# Patient Record
Sex: Female | Born: 1990 | Hispanic: Yes | Smoking: Never smoker
Health system: Southern US, Community
[De-identification: ages and names within clinical notes are randomized; demographics above are authoritative.]

## PROBLEM LIST (undated history)

## (undated) ENCOUNTER — Inpatient Hospital Stay (HOSPITAL_COMMUNITY): Payer: Self-pay

## (undated) HISTORY — PX: NO PAST SURGERIES: SHX2092

---

## 2012-08-29 ENCOUNTER — Emergency Department (HOSPITAL_COMMUNITY)
Admission: EM | Admit: 2012-08-29 | Discharge: 2012-08-29 | Disposition: A | Discharge status: Home / Self Care | Payer: Self-pay | Attending: Emergency Medicine | Admitting: Emergency Medicine

## 2012-08-29 ENCOUNTER — Encounter (HOSPITAL_COMMUNITY): Payer: Self-pay | Admitting: *Deleted

## 2012-08-29 DIAGNOSIS — K2990 Gastroduodenitis, unspecified, without bleeding: Secondary | ICD-10-CM | POA: Insufficient documentation

## 2012-08-29 DIAGNOSIS — K2970 Gastritis, unspecified, without bleeding: Secondary | ICD-10-CM | POA: Insufficient documentation

## 2012-08-29 DIAGNOSIS — Z3202 Encounter for pregnancy test, result negative: Secondary | ICD-10-CM | POA: Insufficient documentation

## 2012-08-29 LAB — CBC WITH DIFFERENTIAL/PLATELET
Basophils Relative: 1 % (ref 0–1)
Eosinophils Absolute: 0.2 10*3/uL (ref 0.0–0.7)
Eosinophils Relative: 3 % (ref 0–5)
Lymphs Abs: 2.3 10*3/uL (ref 0.7–4.0)
MCH: 29.9 pg (ref 26.0–34.0)
MCHC: 35.1 g/dL (ref 30.0–36.0)
MCV: 85.3 fL (ref 78.0–100.0)
Platelets: 226 10*3/uL (ref 150–400)
RBC: 4.48 MIL/uL (ref 3.87–5.11)
RDW: 12.9 % (ref 11.5–15.5)

## 2012-08-29 LAB — COMPREHENSIVE METABOLIC PANEL
ALT: 10 U/L (ref 0–35)
Albumin: 3.7 g/dL (ref 3.5–5.2)
Calcium: 8.7 mg/dL (ref 8.4–10.5)
GFR calc Af Amer: >90 mL/min (ref >90)
Glucose, Bld: 88 mg/dL (ref 70–99)
Sodium: 136 mEq/L (ref 135–145)
Total Protein: 7 g/dL (ref 6.0–8.3)

## 2012-08-29 LAB — URINALYSIS, ROUTINE W REFLEX MICROSCOPIC
Bilirubin Urine: NEGATIVE
Glucose, UA: NEGATIVE mg/dL
Nitrite: NEGATIVE
Specific Gravity, Urine: 1.025 (ref 1.005–1.030)
pH: 7 (ref 5.0–8.0)

## 2012-08-29 LAB — POCT PREGNANCY, URINE: Preg Test, Ur: NEGATIVE

## 2012-08-29 LAB — URINE MICROSCOPIC-ADD ON

## 2012-08-29 MED ORDER — GI COCKTAIL ~~LOC~~
30.0000 mL | Freq: Once | ORAL | Status: AC
  Administered 2012-08-29: 30 mL via ORAL
  Filled 2012-08-29: qty 30

## 2012-08-29 MED ORDER — LANSOPRAZOLE 30 MG PO CPDR: 30.0000 mg | DELAYED_RELEASE_CAPSULE | Freq: Every day | ORAL | Status: DC

## 2012-08-29 MED ORDER — PANTOPRAZOLE SODIUM 40 MG IV SOLR
40.0000 mg | Freq: Once | INTRAVENOUS | Status: AC
  Administered 2012-08-29: 40 mg via INTRAVENOUS
  Filled 2012-08-29: qty 40

## 2012-08-29 NOTE — ED Notes (Signed)
[employer] [phone] used for Spanish, through interpreter, pt states "have had this pain for a couple of days, hurts when I press on it, is not constant, is a stabbing pain, LMP 10/2, no n/v/d"

## 2012-08-29 NOTE — ED Provider Notes (Signed)
History     CSN: [id number]  Arrival date & time 08/29/12  1019   First MD Initiated Contact with Patient 08/29/12 1056      Chief Complaint  Patient presents with  . Abdominal Pain    (Consider location/radiation/quality/duration/timing/severity/associated sxs/prior treatment) HPI Pt has had 4 days of episodic epigastric and LUQ pain. Not associated with food. No N/V/D/C. No vaginal complaints. No fever or chills. Pt states she has been taking multiple doses of naproxen for past few days.  History reviewed. No pertinent past medical history.  History reviewed. No pertinent past surgical history.  No family history on file.  History  Substance Use Topics  . Smoking status: Never Smoker   . Smokeless tobacco: Not on file  . Alcohol Use: No    OB History    Grav Para Term Preterm Abortions TAB SAB Ect Mult Living                  Review of Systems  Constitutional: Negative for fever and chills.  Respiratory: Negative for shortness of breath.   Cardiovascular: Negative for chest pain.  Gastrointestinal: Positive for abdominal pain. Negative for nausea, vomiting, diarrhea and constipation.  Genitourinary: Negative for dysuria, frequency, hematuria, vaginal bleeding, vaginal discharge, vaginal pain and pelvic pain.  Musculoskeletal: Negative for back pain.    Allergies  Review of patient's allergies indicates no known allergies.  Home Medications   Current Outpatient Rx  Name Route Sig Dispense Refill  . ACETAMINOPHEN 325 MG PO TABS Oral Take 325 mg by mouth every 6 (six) hours as needed. For pain    . LANSOPRAZOLE 30 MG PO CPDR Oral Take 1 capsule (30 mg total) by mouth daily. 30 capsule 0    BP 129/69  Pulse 62  Temp 98.4 F (36.9 C) (Oral)  Resp 20  Wt 105 lb 13.1 oz (48 kg)  SpO2 99%  LMP 07/30/2012  Physical Exam  Nursing note and vitals reviewed. Constitutional: She is oriented to person, place, and time. She appears well-developed and  well-nourished. No distress.  HENT:  Head: Normocephalic and atraumatic.  Mouth/Throat: Oropharynx is clear and moist.  Eyes: EOM are normal. Pupils are equal, round, and reactive to light.  Neck: Normal range of motion. Neck supple.  Cardiovascular: Normal rate and regular rhythm.   Pulmonary/Chest: Effort normal and breath sounds normal. No respiratory distress. She has no wheezes. She has no rales.  Abdominal: Soft. Bowel sounds are normal. She exhibits no distension and no mass. There is tenderness (epigastric/LUQ ttp. no rebound or guarding). There is no rebound and no guarding.  Musculoskeletal: Normal range of motion. She exhibits no edema and no tenderness.  Neurological: She is alert and oriented to person, place, and time.  Skin: Skin is warm and dry. No rash noted. No erythema.  Psychiatric: She has a normal mood and affect. Her behavior is normal.    ED Course  Procedures (including critical care time)  Labs Reviewed  URINALYSIS, ROUTINE W REFLEX MICROSCOPIC - Abnormal; Notable for the following:    APPearance CLOUDY (*)     Hgb urine dipstick SMALL (*)     Leukocytes, UA MODERATE (*)     All other components within normal limits  URINE MICROSCOPIC-ADD ON - Abnormal; Notable for the following:    Bacteria, UA FEW (*)     All other components within normal limits  CBC WITH DIFFERENTIAL  COMPREHENSIVE METABOLIC PANEL  LIPASE, BLOOD  POCT PREGNANCY, URINE  URINE  CULTURE   No results found.   1. Gastritis       MDM   Pt states she is feeling much better after meds. Discussed through interpreter need to avoid NSAID's, certain foods. Will rx PPI and advise OTC antacids. Pt told to return immediately for blood in vomit or stool, fever or worsening pain. All questions were answered.        Loren Racer, MD 08/29/12 [phone]

## 2012-08-31 LAB — URINE CULTURE: Colony Count: 95000

## 2012-09-03 NOTE — ED Notes (Signed)
+  Urine. Chart sent to EDP office for review. Chart returned from EDP office. Per [facility] PA-C, likely contaminant. Follow-up with PCP or return to ER for changing/worsening symptoms.

## 2012-10-29 NOTE — L&D Delivery Note (Signed)
Delivery Note At  a viable and healthy female was delivered via  (Presentation: vertex ; LOA ).  APGAR: 9, 9; weight pending .   Placenta status: spontaneous intact.  Cord: 3 vessel with the following complications: None.  Cord pH: not indicated  Anesthesia:  None Episiotomy:  None Lacerations: Small hemostatic laceration left periurethral  Suture Repair: n/a Est. Blood Loss (mL):  Mom to postpartum.  Baby to nursery-stable.  HAIRFORD, AMBER 06/15/2013, 4:47 PM     I was present for and supervised the delivery for this pt.  I agree with the documentation above done by  Dr. Mikel Cella.   Dorthy Hustead, [place] Baseman, MD

## 2012-11-11 ENCOUNTER — Ambulatory Visit: Payer: Self-pay | Admitting: Physician Assistant

## 2012-11-11 VITALS — BP 118/76 | HR 66 | Temp 98.6°F | Resp 16 | Ht <= 58 in | Wt 113.0 lb

## 2012-11-11 DIAGNOSIS — N926 Irregular menstruation, unspecified: Secondary | ICD-10-CM

## 2012-11-11 LAB — POCT URINE PREGNANCY: Preg Test, Ur: POSITIVE

## 2012-11-11 MED ORDER — PRENATAL VITAMINS 28-0.8 MG PO TABS: 1.0000 | ORAL_TABLET | Freq: Every day | ORAL | Status: DC

## 2012-11-11 NOTE — Progress Notes (Signed)
  Subjective:    Patient ID: Susan Freeman, female    DOB: July 16, 1991, 22 y.o.   MRN: [id number]  HPI 22 year old female presents for pregnancy test.  She needs a lab result showing positive test to establish with OB.  LNMP 09/12/12. Denies any bleeding or fluids.  Does have slight abdominal cramping and discomfort. No nausea or vomiting.  She overall feels well. She does not take any daily medications, no known medical problems.      Review of Systems  Cardiovascular: Negative for chest pain.  Gastrointestinal: Positive for abdominal pain (slight cramping). Negative for nausea and vomiting.  Genitourinary: Negative for dysuria, frequency, vaginal bleeding and vaginal discharge.  All other systems reviewed and are negative.       Objective:   Physical Exam  Constitutional: She is oriented to person, place, and time. She appears well-developed and well-nourished.  HENT:  Head: Normocephalic and atraumatic.  Right Ear: External ear normal.  Left Ear: External ear normal.  Eyes: Conjunctivae normal are normal.  Neck: Normal range of motion.  Cardiovascular: Normal rate, regular rhythm and normal heart sounds.   Pulmonary/Chest: Effort normal and breath sounds normal.  Abdominal: Soft. Bowel sounds are normal. There is no tenderness. There is no rebound and no guarding.  Neurological: She is alert and oriented to person, place, and time.  Psychiatric: She has a normal mood and affect. Her behavior is normal. Judgment and thought content normal.     Results for orders placed in visit on 11/11/12  POCT URINE PREGNANCY      Component Value Range   Preg Test, Ur Positive         Assessment & Plan:   1. Menstrual irregularity  POCT urine pregnancy   Start prenatal vitamins.  Copy of labs given to patient - follow up with obstetrics Patient instructed to RTC or go to ER with any worsening abdominal pain or bleeding.

## 2012-11-11 NOTE — Patient Instructions (Signed)
LNMP 09/12/12, estimated due date is 06/19/13

## 2012-12-04 ENCOUNTER — Other Ambulatory Visit: Payer: Self-pay

## 2012-12-04 DIAGNOSIS — Z331 Pregnant state, incidental: Secondary | ICD-10-CM

## 2012-12-04 NOTE — Progress Notes (Signed)
PRENATAL LABS DONE TODAY Susan Freeman 

## 2012-12-05 LAB — OBSTETRIC PANEL
Antibody Screen: NEGATIVE
Basophils Absolute: 0 10*3/uL (ref 0.0–0.1)
Basophils Relative: 0 % (ref 0–1)
Hepatitis B Surface Ag: NEGATIVE
MCHC: 34.4 g/dL (ref 30.0–36.0)
Monocytes Absolute: 0.6 10*3/uL (ref 0.1–1.0)
Neutro Abs: 7 10*3/uL (ref 1.7–7.7)
Neutrophils Relative %: 74 % (ref 43–77)
Platelets: 237 10*3/uL (ref 150–400)
RDW: 14.1 % (ref 11.5–15.5)

## 2012-12-07 LAB — CULTURE, OB URINE: Colony Count: >=100000

## 2012-12-11 ENCOUNTER — Encounter: Payer: Self-pay | Admitting: Family Medicine

## 2013-01-07 ENCOUNTER — Ambulatory Visit (INDEPENDENT_AMBULATORY_CARE_PROVIDER_SITE_OTHER): Payer: Self-pay | Admitting: Family Medicine

## 2013-01-07 ENCOUNTER — Encounter: Payer: Self-pay | Admitting: Family Medicine

## 2013-01-07 DIAGNOSIS — Z3400 Encounter for supervision of normal first pregnancy, unspecified trimester: Secondary | ICD-10-CM

## 2013-01-07 LAB — POCT WET PREP (WET MOUNT): WBC, Wet Prep HPF POC: >20

## 2013-01-07 MED ORDER — AMOXICILLIN 500 MG PO CAPS: 500.0000 mg | ORAL_CAPSULE | Freq: Two times a day (BID) | ORAL | Status: DC

## 2013-01-07 NOTE — Patient Instructions (Signed)
Embarazo - Segundo trimestre (Pregnancy - Second Trimester) El segundo trimestre del embarazo (del 3 al 6mes) es un perodo de evolucin rpida para usted y el beb. Hacia el final del sexto mes, el beb mide aproximadamente 23 cm y pesa 680 g. Comenzar a sentir los movimientos del beb entre las 18 y las 20 semanas de embarazo. Podr sentir las pataditas ("quickening en ingls"). Hay un rpido aumento de peso. Puede segregar un lquido claro (calostro) de las mamas. Quizs sienta pequeas contracciones en el vientre (tero) Esto se conoce como falso trabajo de parto o contracciones de Braxton-Hicks. Es como una prctica del trabajo de parto que se produce cuando el beb est listo para salir. Generalmente los problemas de vmitos matinales ya se han superado hacia el final del primer trimestre. Algunas mujeres desarrollan pequeas manchas oscuras (que se denominan cloasma, mscara del embarazo) en la cara que normalmente se van luego del nacimiento del beb. La exposicin al sol empeora las manchas. Puede desarrollarse acn en algunas mujeres embarazadas, y puede desaparecer en aquellas que ya tienen acn. EXAMENES PRENATALES  Durante los exmenes prenatales, deber seguir realizando pruebas de sangre, segn avance el embarazo. Estas pruebas se realizan para controlar su salud y la del beb. Tambin se realizan anlisis de sangre para conocer los niveles de hemoglobina. La anemia (bajo nivel de hemoglobina) es frecuente durante el embarazo. Para prevenirla, se administran hierro y vitaminas. Tambin se le realizarn exmenes para saber si tiene diabetes entre las 24 y las 28 semanas del embarazo. Podrn repetirle algunas de las pruebas que le hicieron previamente.  En cada visita le medirn el tamao del tero. Esto se realiza para asegurarse de que el beb est creciendo correctamente de acuerdo al estado del embarazo.  Tambin en cada visita prenatal controlarn su presin arterial. Esto se realiza  para asegurarse de que no tenga toxemia.  Se controlar su orina para asegurarse de que no tenga infecciones, diabetes o protena en la orina.  Se controlar su peso regularmente para asegurarse que el aumento ocurre al ritmo indicado. Esto se hace para asegurarse que usted y el beb tienen una evolucin normal.  En algunas ocasiones se realiza una prueba de ultrasonido para confirmar el correcto desarrollo y evolucin del beb. Esta prueba se realiza con ondas sonoras inofensivas para el beb, de modo que el profesional pueda calcular ms precisamente la fecha del parto. Algunas veces se realizan pruebas especializadas del lquido amnitico que rodea al beb. Esta prueba se denomina amniocentesis. El lquido amnitico se obtiene introduciendo una aguja en el vientre (abdomen). Se realiza para controlar los cromosomas en aquellos casos en los que existe alguna preocupacin acerca de algn problema gentico que pueda sufrir el beb. En ocasiones se lleva a cabo cerca del final del embarazo, si es necesario inducir al parto. En este caso se realiza para asegurarse que los pulmones del beb estn lo suficientemente maduros como para que pueda vivir fuera del tero. CAMBIOS QUE OCURREN EN EL SEGUNDO TRIMESTRE DEL EMBARAZO Su organismo atravesar numerosos cambios durante el embarazo. Estos pueden variar de una persona a otra. Converse con el profesional que la asiste acerca los cambios que usted note y que la preocupen.  Durante el segundo trimestre probablemente sienta un aumento del apetito. Es normal tener "antojos" de ciertas comidas. Esto vara de una persona a otra y de un embarazo a otro.  El abdomen inferior comenzar a abultarse.  Podr tener la necesidad de orinar con ms frecuencia debido a que   el tero y el beb presionan sobre la vejiga. Tambin es frecuente contraer ms infecciones urinarias durante el embarazo (dolor al orinar). Puede evitarlas bebiendo gran cantidad de lquidos y vaciando  la vejiga antes y despus de mantener relaciones sexuales.  Podrn aparecer las primeras estras en las caderas, abdomen y mamas. Estos son cambios normales del cuerpo durante el embarazo. No existen medicamentos ni ejercicios que puedan prevenir estos cambios.  Es posible que comience a desarrollar venas inflamadas y abultadas (varices) en las piernas. El uso de medias de descanso, elevar sus pies durante 15 minutos, 3 a 4 veces al da y limitar la sal en su dieta ayuda a aliviar el problema.  Podr sentir acidez gstrica a medida que el tero crece y presiona contra el estmago. Puede tomar anticidos, con la autorizacin de su mdico, para aliviar este problema. Tambin es til ingerir pequeas comidas 4 a 5 veces al da.  La constipacin puede tratarse con un laxante o agregando fibra a su dieta. Beber grandes cantidades de lquidos, comer vegetales, frutas y granos integrales es de gran ayuda.  Tambin es beneficioso practicar actividad fsica. Si ha sido una persona activa hasta el embarazo, podr continuar con la mayora de las actividades durante el mismo. Si ha sido menos activa, puede ser beneficioso que comience con un programa de ejercicios, como realizar caminatas.  Puede desarrollar hemorroides (vrices en el recto) hacia el final del segundo trimestre. Tomar baos de asiento tibios y utilizar cremas recomendadas por el profesional que lo asiste sern de ayuda para los problemas de hemorroides.  Tambin podr sentir dolor de espalda durante este momento de su embarazo. Evite levantar objetos pesados, utilice zapatos de taco bajo y mantenga una buena postura para ayudar a reducir los problemas de espalda.  Algunas mujeres embarazadas desarrollan hormigueo y adormecimiento de la mano y los dedos debido a la hinchazn y compresin de los ligamentos de la mueca (sndrome del tnel carpiano). Esto desaparece una vez que el beb nace.  Como sus pechos se agrandan, necesitar un sujetador  ms grande. Use un sostn de soporte, cmodo y de algodn. No utilice un sostn para amamantar hasta el ltimo mes de embarazo si va a amamantar al beb.  Podr observar una lnea oscura desde el ombligo hacia la zona pbica denominada linea nigra.  Podr observar que sus mejillas se ponen coloradas debido al aumento de flujo sanguneo en la cara.  Podr desarrollar "araitas" en la cara, cuello y pecho. Esto desaparece una vez que el beb nace. INSTRUCCIONES PARA EL CUIDADO DOMICILIARIO  Es extremadamente importante que evite el cigarrillo, hierbas medicinales, alcohol y las drogas no prescriptas durante el embarazo. Estas sustancias qumicas afectan la formacin y el desarrollo del beb. Evite estas sustancias durante todo el embarazo para asegurar el nacimiento de un beb sano.  La mayor parte de los cuidados que se aconsejan son los mismos que los indicados para el primer trimestre del embarazo. Cumpla con las citas tal como se le indic. Siga las instrucciones del profesional que lo asiste con respecto al uso de los medicamentos, el ejercicio y la dieta.  Durante el embarazo debe obtener nutrientes para usted y para su beb. Consuma alimentos balanceados a intervalos regulares. Elija alimentos como carne, pescado, leche y otros productos lcteos descremados, vegetales, frutas, panes integrales y cereales. El profesional le informar cul es el aumento de peso ideal.  Las relaciones sexuales fsicas pueden continuarse hasta cerca del fin del embarazo si no existen otros problemas. Estos   problemas pueden ser la prdida temprana (prematura) de lquido amnitico de las membranas, sangrado vaginal, dolor abdominal u otros problemas mdicos o del embarazo.  Realice actividad fsica todos los das, si no tiene restricciones. Consulte con el profesional que la asiste si no sabe con certeza si determinados ejercicios son seguros. El mayor aumento de peso tiene lugar durante los ltimos 2 trimestres del  embarazo. El ejercicio la ayudar a:  Controlar su peso.  Ponerla en forma para el parto.  Ayudarla a perder peso luego de haber dado a luz.  Use un buen sostn o como los que se usan para hacer deportes para aliviar la sensibilidad de las mamas. Tambin puede serle til si lo usa mientras duerme. Si pierde calostro, podr utilizar apsitos en el sostn.  No utilice la baera con agua caliente, baos turcos y saunas durante el embarazo.  Utilice el cinturn de seguridad sin excepcin cuando conduzca. Este la proteger a usted y al beb en caso de accidente.  Evite comer carne cruda, queso crudo, y el contacto con los utensilios y desperdicios de los gatos. Estos elementos contienen grmenes que pueden causar defectos de nacimiento en el beb.  El segundo trimestre es un buen momento para visitar a su dentista y evaluar su salud dental si an no lo ha hecho. Es importante mantener los dientes limpios. Utilice un cepillo de dientes blando. Cepllese ms suavemente durante el embarazo.  Es ms fcil perder algo de orina durante el embarazo. Apretar y fortalecer los msculos de la pelvis la ayudar con este problema. Practique detener la miccin cuando est en el bao. Estos son los mismos msculos que necesita fortalecer. Son tambin los mismos msculos que utiliza cuando trata de evitar los gases. Puede practicar apretando estos msculos 10 veces, y repetir esto 3 veces por da aproximadamente. Una vez que conozca qu msculos debe apretar, no realice estos ejercicios durante la miccin. Puede favorecerle una infeccin si la orina vuelve hacia atrs.  Pida ayuda si tiene necesidades econmicas, de asesoramiento o nutricionales durante el embarazo. El profesional podr ayudarla con respecto a estas necesidades, o derivarla a otros especialistas.  La piel puede ponerse grasa. Si esto sucede, lvese la cara con un jabn suave, utilice un humectante no graso y maquillaje con base de aceite o  crema. CONSUMO DE MEDICAMENTOS Y DROGAS DURANTE EL EMBARAZO  Contine tomando las vitaminas apropiadas para esta etapa tal como se le indic. Las vitaminas deben contener un miligramo de cido flico y deben suplementarse con hierro. Guarde todas las vitaminas fuera del alcance de los nios. La ingestin de slo un par de vitaminas o tabletas que contengan hierro puede ocasionar la muerte en un beb o en un nio pequeo.  Evite el uso de medicamentos, inclusive los de venta libre y hierbas que no hayan sido prescriptos o indicados por el profesional que la asiste. Algunos medicamentos pueden causar problemas fsicos al beb. Utilice los medicamentos de venta libre o de prescripcin para el dolor, el malestar o la fiebre, segn se lo indique el profesional que lo asiste. No utilice aspirina.  El consumo de alcohol est relacionado con ciertos defectos de nacimiento. Esto incluye el sndrome de alcoholismo fetal. Debe evitar el consumo de alcohol en cualquiera de sus formas. El cigarrillo causa nacimientos prematuros y bebs de bajo peso. El uso de drogas recreativas est absolutamente prohibido. Son muy nocivas para el beb. Un beb que nace de una madre adicta, ser adicto al nacer. Ese beb tendr los mismos   sntomas de abstinencia que un adulto.  Infrmele al profesional si consume alguna droga.  No consuma drogas ilegales. Pueden causarle mucho dao al beb. SOLICITE ATENCIN MDICA SI: Tiene preguntas o preocupaciones durante su embarazo. Es mejor que llame para consultar las dudas que esperar hasta su prxima visita prenatal. De esta forma se sentir ms tranquila.  SOLICITE ATENCIN MDICA DE INMEDIATO SI:  La temperatura oral se eleva sin motivo por encima de 102 F (38.9 C) o segn le indique el profesional que lo asiste.  Tiene una prdida de lquido por la vagina (canal de parto). Si sospecha una ruptura de las membranas, tmese la temperatura y llame al profesional para informarlo sobre  esto.  Observa unas pequeas manchas, una hemorragia vaginal o elimina cogulos. Notifique al profesional acerca de la cantidad y de cuntos apsitos est utilizando. Unas pequeas manchas de sangre son algo comn durante el embarazo, especialmente despus de mantener relaciones sexuales.  Presenta un olor desagradable en la secrecin vaginal y observa un cambio en el color, de transparente a blanco.  Contina con las nuseas y no obtiene alivio de los remedios indicados. Vomita sangre o algo similar a la borra del caf.  [place] o sube ms de 900 g. en una semana, o segn lo indicado por el profesional que la asiste.  Observa que se le hinchan el rostro, las manos, los pies o las piernas.  Ha estado expuesta a la rubola y no ha sufrido la enfermedad.  Ha estado expuesta a la quinta enfermedad o a la varicela.  Presenta dolor abdominal. Las molestias en el ligamento redondo son una causa no cancerosa (benigna) frecuente de dolor abdominal durante el embarazo. El profesional que la asiste deber evaluarla.  Presenta dolor de cabeza intenso que no se alivia.  Presenta fiebre, diarrea, dolor al orinar o le falta la respiracin.  Presenta dificultad para ver, visin borrosa, o visin doble.  Sufre una cada, un accidente de trnsito o cualquier tipo de trauma.  Vive en un hogar en el que existe violencia fsica o mental. Document Released: 07/25/2005 Document Revised: 01/07/2012 [employer] Patient Information 2013 [employer].  

## 2013-01-07 NOTE — Progress Notes (Signed)
  Subjective:    Susan Freeman is a G1P0 [redacted]w[redacted]d being seen today for her first obstetrical visit.  Her obstetrical history is significant for establishing care in the second trimester. Patient does intend to breast feed. Pregnancy history fully reviewed.  Planned pregnancy. Been in [place] for 8 months, previously in [place]. Living with partner; no other children in the house. Not working. Has not started thinking about getting baby supplies.  Patient reports backache, nausea, no bleeding, no leaking and not feeling baby move.  Pre-pregnancy weight was 118lbs. Currently taking prenatal vitamin.   There were no vitals filed for this visit.  HISTORY: OB History   Grav Para Term Preterm Abortions TAB SAB Ect Mult Living   1              # Outc Date GA Lbr Len/2nd Wgt Sex Del Anes PTL Lv   1 CUR              No past medical history on file. No past surgical history on file. No family history on file.   Exam    Uterus:   Gravid  Pelvic Exam:    Perineum: No Hemorrhoids   Vulva: normal   Vagina:  normal mucosa, normal discharge   pH: Not indicated   Cervix: no bleeding following Pap, no cervical motion tenderness and no lesions   Adnexa: not evaluated  System: Breast:  normal appearance, no masses or tenderness   Skin: normal coloration and turgor, no rashes    Neurologic: negative   Extremities: normal strength, tone, and muscle mass   HEENT PERRLA and neck supple with midline trachea   Mouth/Teeth mucous membranes moist, pharynx normal without lesions   Neck supple and no masses   Cardiovascular: regular rate and rhythm, no murmurs or gallops   Respiratory:  appears well, vitals normal, no respiratory distress, acyanotic, normal RR, ear and throat exam is normal, neck free of mass or lymphadenopathy, chest clear, no wheezing, crepitations, rhonchi, normal symmetric air entry   Abdomen: soft, non-tender; bowel sounds normal; no masses,  no organomegaly   Urinary:  urethral meatus normal      Assessment:    Pregnancy: G1P0 Patient Active Problem List  Diagnosis  . Supervision of normal first pregnancy     Plan:   Initial labs reviewed - OB urine culture positive for pan-sensitive E.coli. Started on Amoxicillin x 1 week. Will need repeat urine at next appointment. Prenatal vitamins. Problem list reviewed and updated. Genetic Screening discussed Integrated Screen: undecided. Ultrasound discussed; fetal survey: Will discuss at follow up appointment. Awaiting [employer]. Follow up in 3 weeks  HAIRFORD, AMBER 01/07/2013

## 2013-01-12 ENCOUNTER — Ambulatory Visit (INDEPENDENT_AMBULATORY_CARE_PROVIDER_SITE_OTHER): Payer: Self-pay | Admitting: Family Medicine

## 2013-01-12 VITALS — BP 122/76 | Temp 98.2°F | Wt 112.0 lb

## 2013-01-12 NOTE — Patient Instructions (Signed)
Exantema viral, Adultos  (Viral Exanthems, Adult)  El exantema viral es una erupcin. Aparece cuando un tipo de germen (virus) infecta la piel. Generalmente desaparece sin tratamiento. CUIDADOS EN EL HOGAR   Tome la medicacin slo como le haya indicado el mdico.  Beba gran cantidad de lquido para mantener la orina de tono claro o color amarillo plido. SOLICITE AYUDA DE INMEDIATO SI:   Siente bultos en el cuello.  Siente dolor en el rostro, cerca de los ojos y la nariz (senos paranasales).  No mejora luego de 3 das.  Tiene dolores musculares.  Se siente muy cansado.  Tiene tos y escupe saliva espesa (moco).  Tiene fiebre.  Presenta enrojecimiento o dolor en los ojos.  Tiene llagas en la boca y no puede beber o comer.  Siente dolor de garganta con presencia de un lquido blanco amarillento (pus) y tiene dificultad para tragar.  Tiene dolor o rigidez en el cuello.  Siente un dolor de cabeza intenso.  No puede detener los vmitos. ASEGRESE DE QUE:   Comprende estas instrucciones.  Controlar su enfermedad.  Solicitar ayuda de inmediato si no mejora o si empeora. Document Released: 03/07/2011 Document Revised: 01/07/2012 [facility] Patient Information 2013 [place], [place].

## 2013-01-12 NOTE — Assessment & Plan Note (Signed)
Continue hydrocortisone and return as needed. Should resolve on its own. No red flag symptoms.

## 2013-01-12 NOTE — Progress Notes (Signed)
Patient ID: Susan Freeman, female   DOB: 1990/11/12, 21 y.o.   MRN: [id number]  ([employer] interpreter present throughout entire visit)  Rash on hands and feet for 4 days, getting worse. Nobody else has it, she has never had it before. Got hydrocortisone cream this morning which has helped some. Red, itching. Started taking antibiotic after rash started. No fevers, no dyspnea, no swelling of lips or tongue.   Doing well with pregnancy. No N/V, +fetal movement. No abd pain  O: Gen: Well appearing Skin: Light pink small papules on dorsum of hand and redness of the palms. No blisters or burrows. Also has slightly more red macules on feet without significant diffuse erythema or pain.  A/P: Most likely viral exanthem. Does not appear to be infectious. No signs of scabies at this time. Less likely puppp because not on trunk or face. Continue to use hydrocortisone cream for comfort but this rash should resolve on its own. Given red flag symptoms that should prompt her return including worsening of rash, fevers, difficulty breathing. If not resolved in one week, return for evaluation. Pt agrees with plan.

## 2013-01-22 ENCOUNTER — Ambulatory Visit: Payer: Self-pay | Admitting: Family Medicine

## 2013-01-28 ENCOUNTER — Ambulatory Visit (INDEPENDENT_AMBULATORY_CARE_PROVIDER_SITE_OTHER): Payer: No Typology Code available for payment source | Admitting: Family Medicine

## 2013-01-28 VITALS — BP 110/58 | Temp 98.0°F | Wt 110.0 lb

## 2013-01-28 DIAGNOSIS — O2342 Unspecified infection of urinary tract in pregnancy, second trimester: Secondary | ICD-10-CM

## 2013-01-28 DIAGNOSIS — Z3400 Encounter for supervision of normal first pregnancy, unspecified trimester: Secondary | ICD-10-CM

## 2013-01-28 DIAGNOSIS — O2390 Unspecified genitourinary tract infection in pregnancy, unspecified trimester: Secondary | ICD-10-CM

## 2013-01-28 DIAGNOSIS — Z3402 Encounter for supervision of normal first pregnancy, second trimester: Secondary | ICD-10-CM

## 2013-01-28 LAB — POCT URINALYSIS DIPSTICK
Glucose, UA: NEGATIVE
Nitrite, UA: NEGATIVE
Spec Grav, UA: 1.015
Urobilinogen, UA: 0.2

## 2013-01-28 LAB — POCT UA - MICROSCOPIC ONLY

## 2013-01-28 NOTE — Addendum Note (Signed)
Addended by: [place], Sidra Oldfield on: 01/28/2013 05:44 PM   Modules accepted: Orders

## 2013-01-28 NOTE — Progress Notes (Signed)
22 yo G1 at [redacted]w[redacted]d presenting for routine prenatal. [employer] Jean Rosenthal present as interpreter. Doing well with no concerns. Good fetal movement. No bleeding, vaginal discharge or contractions. Seen for rash, now resolved except for back of right hand. Using lotions and Hydrocortisone cream.  O: See flowsheet  A/P: - Continue routine prenatal care - Continue PNV - Routine UA today to check for resolution of asymptomatic bacteruria shows large leukocytes so will send for culture - Needs anatomy scan. Will order today at [facility] - Continue hydrocortisone cream for rash - Needs appt with OB clinic for next visit in 4 weeks

## 2013-01-28 NOTE — Patient Instructions (Addendum)
It was good to see you today. Everything looks good.  -Anatomy ultrasound -Urine today -Come back to OB clinic in 4 weeks  Miracle Mongillo M. Sayan Aldava, M.D.

## 2013-01-30 LAB — CULTURE, OB URINE
Colony Count: NO GROWTH
Organism ID, Bacteria: NO GROWTH

## 2013-02-02 ENCOUNTER — Ambulatory Visit (HOSPITAL_COMMUNITY)
Admission: RE | Admit: 2013-02-02 | Discharge: 2013-02-02 | Disposition: A | Discharge status: Home / Self Care | Payer: Self-pay | Source: Ambulatory Visit | Attending: Family Medicine | Admitting: Family Medicine

## 2013-02-02 DIAGNOSIS — Z3402 Encounter for supervision of normal first pregnancy, second trimester: Secondary | ICD-10-CM

## 2013-02-02 DIAGNOSIS — B09 Unspecified viral infection characterized by skin and mucous membrane lesions: Secondary | ICD-10-CM

## 2013-02-02 DIAGNOSIS — Z363 Encounter for antenatal screening for malformations: Secondary | ICD-10-CM | POA: Insufficient documentation

## 2013-02-02 DIAGNOSIS — Z1389 Encounter for screening for other disorder: Secondary | ICD-10-CM | POA: Insufficient documentation

## 2013-02-02 DIAGNOSIS — O358XX0 Maternal care for other (suspected) fetal abnormality and damage, not applicable or unspecified: Secondary | ICD-10-CM | POA: Insufficient documentation

## 2013-03-05 ENCOUNTER — Other Ambulatory Visit: Payer: Self-pay | Admitting: Family Medicine

## 2013-03-05 ENCOUNTER — Ambulatory Visit (INDEPENDENT_AMBULATORY_CARE_PROVIDER_SITE_OTHER): Payer: No Typology Code available for payment source | Admitting: Family Medicine

## 2013-03-05 VITALS — BP 106/71 | Temp 97.7°F | Wt 115.7 lb

## 2013-03-05 DIAGNOSIS — Z3402 Encounter for supervision of normal first pregnancy, second trimester: Secondary | ICD-10-CM

## 2013-03-05 DIAGNOSIS — Z3400 Encounter for supervision of normal first pregnancy, unspecified trimester: Secondary | ICD-10-CM

## 2013-03-05 NOTE — Progress Notes (Signed)
Patient seen today in OB clinic, visit conducted in [place].  She voices no concerns.  Feeling daily fetal movement, no vaginal bleeding or discharge, or LOF.  No dysuria.  She has had a few rare brief non-sustained contractions.  There are no first-degree relatives with DM in her family (she does have grandparents with DM).  No smokers in household. Plans on breast and bottle feeding, in order to allow her to work outside the home.  We discussed options for breastfeeding and working, plan for extensive lactation consultation at delivery. Reviewed the results of anatomy scan done since last visit.  Reviewed eating habits and weight gain, which is adequate at this point in her pregnancy.  Discussed preterm labor precautions, kick counts.  For follow up prenatal visit with Dr Mikel Cella at 28 weeks, will need CBC/HIV/RPR at that visit, as well as PMH Risk Screening Tool and PHQ9 at that visit as well as Tdap. Paula Compton, MD

## 2013-03-05 NOTE — Assessment & Plan Note (Signed)
See Vitals and Notes section for OB Clinic note (03/05/2013).  JB

## 2013-03-05 NOTE — Patient Instructions (Addendum)
Fue un placer atenderle hoy en la clinica prenatal del [facility].  Tiene 24 semanas y 6 dias de [employer].  El ultrasonido que se hizo en el [facility] de la Mujer salio' completamente normal.  El examen fisico hoy esta' normal tambien.  Le estamos haciendo la prueba del IT consultant.    Vamos a chequearle el conteo de [place] y algunas otras pruebas rutinarias cuando vuelva para su proxima cita en 3 a 4 semanas.   Vea abajo la informacion sobre el Parto Prematuro que conversamos en la visita de hoy:  Sport and exercise psychologist  (Preterm Labor) El parto prematuro comienza antes de la semana 37 de [place]. La duracin de un embarazo normal es de 39 a 41 semanas.  CAUSAS  Generalmente no hay una causa que pueda identificarse. Sin embargo, una de las causas conocidas ms frecuentes son las infecciones. Las infecciones del tero, el cuello, la vagina, el lquido [place], la vejiga, los riones y Teacher, adult education de los pulmones (neumona) pueden hacer que el trabajo de parto se inicie. Otras causas son:   Infecciones urogenitales, como infecciones por hongos y vaginosis bacteriana.  Anormalidades uterinas (forma del tero, sptum uterino, fibromas, hemorragias en la placenta).  Un cuello que ha sido operado y se abre prematuramente.  Malformaciones del beb.  Gestaciones mltiples (mellizos, trillizos y ms).  Ruptura del saco amnitico. :Otros factores de riesgo del parto prematuro son   Historia previa de Sport and exercise psychologist.  Ruptura prematura de las [place].  La placenta cubre la apertura del cuello (placenta previa).  La placenta se separa del tero (abrupcin placentaria).  El cuello es demasiado dbil para contener al beb en el tero (cuello incompetente).  Hay mucho lquido en el saco amnitico (polihidramnios).  Consumo de drogas o hbito de fumar durante Firefighter.  No aumentar de peso lo suficiente durante el [employer].  Mujeres menores de 18 aos o [address] de [address] [place].  Nivel  socioeconmico bajo.  Raza afroamericana. SNTOMAS  Los signos y sntomas son:   Clicos del tipo menstrual  Contracciones con un intervalo entre 30 y 70 segundos, comienzan a ser regulares, se hacen ms frecuentes y se hacen ms intensas y dolorosas.  Contracciones que comienzan en la parte superior del tero y se expanden hacia abajo, hacia la zona inferior del abdomen y la espalda.  Sensacin de presin en la pelvis o dolor en la espalda.  Aparece una secrecin acuosa o sanguinolenta por la vagina. DIAGNSTICO  El diagnstico puede confirmarse:   Con un examen vaginal.  Ecografa del cuello.  Muestra (hisopado) de las secreciones crvico-vaginales. Estas muestras se analizan para buscar la presencia de fibronectina fetal. Esta protena que se encuentra en las secreciones del tero y se asocia con el parto prematuro.  Monitoreo fetal TRATAMIENTO  Segn el tiempo del Psychiatrist y otras [place], el mdico puede indicar reposo en cama. Si es necesario, le indicarn medicamentos para TEFL teacher las contracciones y apurar la maduracin de los pulmones del feto. Si el trabajo de parto se inicia antes de las 34 semanas de [place], se recomienda la hospitalizacin. El tratamiento depende de las condiciones en que se encuentre la madre y el beb.  PREVENCIN  Hay algunas cosas que [employer] puede hacer para disminuir el riesgo de trabajo de parto prematuro en futuros [employer]. Una mam puede:   Dejar de fumar.  Mantener un peso saludable y evitar sustancias qumicas y drogas innecesarias.  Controlar todo tipo de infeccin.  Informar al mdico si tiene Neomia Dear  historia conocida de [employer]. Document Released: 01/22/2008 Document Revised: 01/07/2012 [facility] Patient Information 2013 [place], [place].

## 2013-03-20 ENCOUNTER — Encounter (HOSPITAL_COMMUNITY): Payer: Self-pay | Admitting: *Deleted

## 2013-03-20 ENCOUNTER — Inpatient Hospital Stay (HOSPITAL_COMMUNITY)
Admission: AD | Admit: 2013-03-20 | Discharge: 2013-03-21 | Disposition: A | Discharge status: Home / Self Care | Payer: No Typology Code available for payment source | Source: Ambulatory Visit | Attending: Obstetrics and Gynecology | Admitting: Obstetrics and Gynecology

## 2013-03-20 DIAGNOSIS — O99891 Other specified diseases and conditions complicating pregnancy: Secondary | ICD-10-CM | POA: Insufficient documentation

## 2013-03-20 DIAGNOSIS — R42 Dizziness and giddiness: Secondary | ICD-10-CM | POA: Insufficient documentation

## 2013-03-20 DIAGNOSIS — R209 Unspecified disturbances of skin sensation: Secondary | ICD-10-CM | POA: Insufficient documentation

## 2013-03-20 LAB — URINALYSIS, ROUTINE W REFLEX MICROSCOPIC
Glucose, UA: NEGATIVE mg/dL
Specific Gravity, Urine: 1.01 (ref 1.005–1.030)
pH: 6.5 (ref 5.0–8.0)

## 2013-03-20 LAB — URINE MICROSCOPIC-ADD ON

## 2013-03-20 NOTE — MAU Note (Addendum)
PT SAYS  WITH INTERPRETER-    HAS HAD SHARP PAIN  IN HEAD  AT 0600, AND 5PM-    AND NOW FEELS  PAIN BUT NOT AS BAD AS OTHERS.   NO DIZZINESS NOW , NOR WHEN SHE WALKS.    FEELS DIZZINESS-   WHEN SHE STANDS.  ATE AT 6PM.   DRINKING PLENTY OF  WATER.   GETS PNC AT [facility].    SHARP PAIN IN HEAD  NEVER HAPPENED BEFORE  TODAY.    PAIN IS SLOWING GOING AWAY.    DID NOT  TAKE ANY MED

## 2013-03-20 NOTE — MAU Note (Signed)
I was feeling bad. Feeling dizzy and feel like my hads don't have good circulation. Hands feel numb

## 2013-04-07 ENCOUNTER — Ambulatory Visit (INDEPENDENT_AMBULATORY_CARE_PROVIDER_SITE_OTHER): Payer: No Typology Code available for payment source | Admitting: Family Medicine

## 2013-04-07 VITALS — BP 103/68 | Temp 98.2°F | Wt 119.0 lb

## 2013-04-07 DIAGNOSIS — Z3400 Encounter for supervision of normal first pregnancy, unspecified trimester: Secondary | ICD-10-CM

## 2013-04-07 DIAGNOSIS — Z3402 Encounter for supervision of normal first pregnancy, second trimester: Secondary | ICD-10-CM

## 2013-04-07 DIAGNOSIS — Z23 Encounter for immunization: Secondary | ICD-10-CM

## 2013-04-07 LAB — RPR

## 2013-04-07 LAB — CBC
Hemoglobin: 12.2 g/dL (ref 12.0–15.0)
Platelets: 250 10*3/uL (ref 150–400)
RBC: 3.99 MIL/uL (ref 3.87–5.11)
WBC: 7.8 10*3/uL (ref 4.0–10.5)

## 2013-04-07 LAB — HIV ANTIBODY (ROUTINE TESTING W REFLEX): HIV: NONREACTIVE

## 2013-04-07 NOTE — Progress Notes (Signed)
22 yo G1 at [redacted]w[redacted]d routine prenatal visit. Reports bilateral hip pain worse in the evenings. She states she has been walking which does not help her hips. Reports good fetal movements and irregular contractions. No bleeding and a little clear fluid which she reports as a small amount of clear fluid.   O: See flowsheet  A/P: - Tylenol prn hip pain. Encouraged to use belly support to help with pressure - CBC, HIV, RPR collected today - TDap given - PMH form completed  - RTC in 2 weeks for follow up

## 2013-04-07 NOTE — Patient Instructions (Addendum)
Take Tylenol if needed for your hip pain.  Return in 2 weeks to see me, or sooner if you need anything.  Kori Goins M. Avereigh Spainhower, M.D.

## 2013-04-21 ENCOUNTER — Encounter: Payer: No Typology Code available for payment source | Admitting: Family Medicine

## 2013-04-23 ENCOUNTER — Ambulatory Visit (INDEPENDENT_AMBULATORY_CARE_PROVIDER_SITE_OTHER): Payer: No Typology Code available for payment source | Admitting: Family Medicine

## 2013-04-23 VITALS — BP 101/65 | Temp 98.1°F | Wt 120.0 lb

## 2013-04-23 DIAGNOSIS — Z3403 Encounter for supervision of normal first pregnancy, third trimester: Secondary | ICD-10-CM

## 2013-04-23 DIAGNOSIS — Z3400 Encounter for supervision of normal first pregnancy, unspecified trimester: Secondary | ICD-10-CM

## 2013-04-23 NOTE — Progress Notes (Signed)
22 yo G1 at [redacted]w[redacted]d routine prenatal. Adopt-[employer]-mom interpreter present. No concerns. Good fetal movement, no fluid or bleeding. Intermittent contractions.  Getting house ready for baby.  O: See flowsheet Bedside ultrasound today in vertex position  A/P: - Continue prenatal vitamin - Tums as needed for reflux - RTC in 2 weeks

## 2013-04-23 NOTE — Patient Instructions (Addendum)
Tums for your heartburn.  Return to clinic in 2 weeks.  Berwyn Bigley M. Tra Wilemon, M.D.

## 2013-05-08 ENCOUNTER — Ambulatory Visit (INDEPENDENT_AMBULATORY_CARE_PROVIDER_SITE_OTHER): Payer: No Typology Code available for payment source | Admitting: Family Medicine

## 2013-05-08 VITALS — BP 110/70 | Temp 97.9°F | Wt 122.0 lb

## 2013-05-08 DIAGNOSIS — Z3400 Encounter for supervision of normal first pregnancy, unspecified trimester: Secondary | ICD-10-CM

## 2013-05-08 DIAGNOSIS — Z3403 Encounter for supervision of normal first pregnancy, third trimester: Secondary | ICD-10-CM

## 2013-05-08 NOTE — Progress Notes (Signed)
22 yo G1 at 34.0 weeks. Adopt-[employer]-mom interpreter present for entire appointment. Pt is concerned about a "worm like" sensation on her abdomen. Cramps in legs after sitting a long time, no numbness. Some edema with standing a long time. Good fetal movement, no bleeding, no vaginal discharge, no liquid. No contractions.   O: See flowseet  A/P: - Continue prenatal vitamin - Drink water - Return 2.5 weeks for GBS, GC/Ch

## 2013-05-08 NOTE — Patient Instructions (Signed)
Return in 2.5 weeks for your next visit. Drink lots of water.  Go to [facility] for any concerns.  Ciin Brazzel M. Jeania Nater, M.D.

## 2013-05-28 ENCOUNTER — Other Ambulatory Visit (HOSPITAL_COMMUNITY)
Admission: RE | Admit: 2013-05-28 | Discharge: 2013-05-28 | Disposition: A | Discharge status: Home / Self Care | Payer: No Typology Code available for payment source | Source: Ambulatory Visit | Attending: Family Medicine | Admitting: Family Medicine

## 2013-05-28 ENCOUNTER — Ambulatory Visit (INDEPENDENT_AMBULATORY_CARE_PROVIDER_SITE_OTHER): Payer: No Typology Code available for payment source | Admitting: Family Medicine

## 2013-05-28 VITALS — BP 105/67 | Temp 98.4°F | Wt 125.0 lb

## 2013-05-28 DIAGNOSIS — Z331 Pregnant state, incidental: Secondary | ICD-10-CM

## 2013-05-28 DIAGNOSIS — Z113 Encounter for screening for infections with a predominantly sexual mode of transmission: Secondary | ICD-10-CM | POA: Insufficient documentation

## 2013-05-28 NOTE — Patient Instructions (Addendum)
Sport and exercise psychologist  (Preterm Labor) El parto prematuro comienza antes de la semana 37 de [place]. La duracin de un embarazo normal es de 39 a 41 semanas.  CAUSAS  Generalmente no hay una causa que pueda identificarse. Sin embargo, una de las causas conocidas ms frecuentes son las infecciones. Las infecciones del tero, el cuello, la vagina, el lquido [place], la vejiga, los riones y Teacher, adult education de los pulmones (neumona) pueden hacer que el trabajo de parto se inicie. Otras causas son:   Infecciones urogenitales, como infecciones por hongos y vaginosis bacteriana.  Anormalidades uterinas (forma del tero, sptum uterino, fibromas, hemorragias en la placenta).  Un cuello que ha sido operado y se abre prematuramente.  Malformaciones del beb.  Gestaciones mltiples (mellizos, trillizos y ms).  Ruptura del saco amnitico. :Otros factores de riesgo del parto prematuro son   Historia previa de Sport and exercise psychologist.  Ruptura prematura de las [place].  La placenta cubre la apertura del cuello (placenta previa).  La placenta se separa del tero (abrupcin placentaria).  El cuello es demasiado dbil para contener al beb en el tero (cuello incompetente).  Hay mucho lquido en el saco amnitico (polihidramnios).  Consumo de drogas o hbito de fumar durante Firefighter.  No aumentar de peso lo suficiente durante el [employer].  Mujeres menores de 18 aos o [address] de [address] [place].  Nivel socioeconmico bajo.  Raza afroamericana. SNTOMAS  Los signos y sntomas son:   Clicos del tipo menstrual  Contracciones con un intervalo entre 30 y 70 segundos, comienzan a ser regulares, se hacen ms frecuentes y se hacen ms intensas y dolorosas.  Contracciones que comienzan en la parte superior del tero y se expanden hacia abajo, hacia la zona inferior del abdomen y la espalda.  Sensacin de presin en la pelvis o dolor en la espalda.  Aparece una secrecin acuosa o sanguinolenta por la  vagina. DIAGNSTICO  El diagnstico puede confirmarse:   Con un examen vaginal.  Ecografa del cuello.  Muestra (hisopado) de las secreciones crvico-vaginales. Estas muestras se analizan para buscar la presencia de fibronectina fetal. Esta protena que se encuentra en las secreciones del tero y se asocia con el parto prematuro.  Monitoreo fetal TRATAMIENTO  Segn el tiempo del Psychiatrist y otras [place], el mdico puede indicar reposo en cama. Si es necesario, le indicarn medicamentos para TEFL teacher las contracciones y apurar la maduracin de los pulmones del feto. Si el trabajo de parto se inicia antes de las 34 semanas de [place], se recomienda la hospitalizacin. El tratamiento depende de las condiciones en que se encuentre la madre y el beb.  PREVENCIN  Hay algunas cosas que [employer] puede hacer para disminuir el riesgo de trabajo de parto prematuro en futuros [employer]. Una mam puede:   Dejar de fumar.  Mantener un peso saludable y evitar sustancias qumicas y drogas innecesarias.  Controlar todo tipo de infeccin.  Informar al mdico si tiene una historia conocida de parto prematuro. Document Released: 01/22/2008 Document Revised: 01/07/2012 [facility] Patient Information 2014 [place], [place].

## 2013-05-28 NOTE — Progress Notes (Signed)
22 yo G1 at 36.6 weeks. Phone interpreter used for visit. No concerns. Good fetal movement, no bleeding or gush of fluids. Contractions are at night and irregular.  O: See flowsheet Cervix visualized with sterile speculum. Appears red and slightly dilated with clear mucus at os. GC/ch collected. Speculum removed and GBS collected. Cervical exam was 2cm/50%  A/P: - Labor precautions discussed - RTC in 1 week

## 2013-05-30 LAB — STREP B DNA PROBE: GBSP: NEGATIVE

## 2013-06-03 ENCOUNTER — Ambulatory Visit (INDEPENDENT_AMBULATORY_CARE_PROVIDER_SITE_OTHER): Payer: No Typology Code available for payment source | Admitting: Family Medicine

## 2013-06-03 VITALS — BP 103/64 | Temp 98.1°F | Wt 129.0 lb

## 2013-06-03 DIAGNOSIS — Z3400 Encounter for supervision of normal first pregnancy, unspecified trimester: Secondary | ICD-10-CM

## 2013-06-03 DIAGNOSIS — Z3403 Encounter for supervision of normal first pregnancy, third trimester: Secondary | ICD-10-CM

## 2013-06-03 NOTE — Progress Notes (Signed)
22 yo G1 37.5 weeks Language resources interpreter present. Reports irregular contractions, stronger last night every 30 minutes. Last contractions at noon today. Good fetal movement, no vaginal bleeding, some clear discharge.  O: See flowsheet  A/P: Discussed signs of labor. RTC in 1 week.

## 2013-06-03 NOTE — Patient Instructions (Addendum)
Contracciones de Braxton Hicks  (Braxton Hicks Contractions)  Usted presenta un falso trabajo de parto. Durante todo el embarazo aparecen con frecuencia contracciones del útero. Hacia el final del embarazo (32-34 semanas) estas contracciones (Braxton Hicks) pueden hacerse más fuertes. No se trata de un trabajo de parto verdadero porque no producen un agrandamiento (dilatación) y afinamiento del cuello del útero. Algunas veces resulta difícil distinguirlas del trabajo de parto verdadero porque en algunos casos llegan a ser muy intensas y las personas tienen distinta tolerancia al dolor. No debe sentirse avergonzada si ingresa al hospital con un falso trabajo de parto. En ocasiones la única forma de saber si está en un parto verdadero es observar los cambios en el cuello del útero. A veces, la única forma de saber si realmente está en trabajo de parto es para el médico observar los cambios en el útero.  Como diferenciar el trabajo de parto falso del verdadero:  · Trabajos de parto falso.  · Las contracciones falsas generalmente duran menos y no son tan intensas como las verdaderas.  · Generalmente se sienten en la zona inferior del abdomen y en la ingle.  · Pueden aliviarse con una caminata o cambiar de posición mientras se está acostada.  · A medida que pasa el tiempo son más cortas y débiles.  · Generalmente son irregulares.  · No se hacen progresivamente más intensas y cercanas entre sí como las verdaderas.  · Trabajo de parto verdadero.  · Las contracciones verdaderas duran de 30 a 70 segundos, son más regulares, generalmente se hacen más intensas y aumentan en frecuencia.  · No desaparecen al caminar.  · La molestia generalmente se siente en la parte superior del útero y se extiende hacia la zona inferior del abdomen y hacia la cintura.  · El profesional que la asiste podrá examinarla para determinar si el trabajo de parto es verdadero. El examen mostrará si el cuello del útero se está dilatando y afinando.  Si  no hay problemas prenatales u otras complicaciones de la salud asociadas al embarazo, no habrá inconvenientes si la envían a su casa y espera el comienzo del verdadero trabajo de parto.  INSTRUCCIONES PARA EL CUIDADO DOMICILIARIO  · Siga con los ejercicios y las indicaciones habituales.  · Tome los medicamentos como se le indicó.  · Cumpla con las citas regularmente.  · Coma y beba ligero si cree que dará a luz.  · Si se siente incómoda por las contracciones:  · Cambie de actividad, si está acostada o en reposo, camine y si está caminando, repose.  · Siéntense y repose en una bañadera con agua caliente.  · Beba entre 2 y 3 vasos de agua. La deshidratación puede causar contracciones BH.  · Respire lenta y profundamente varias veces por hora.  SOLICITE ATENCIÓN MÉDICA DE INMEDIATO SI:  · Las contracciones se intensifican, se hacen más regulares y cercanas entre sí.  · Tiene una pérdida importante de líquido de la vagina  · La temperatura oral se eleva sin motivo por encima de 38,9° C (102° F) o según le indique el profesional que la asiste.  · Elimina una mucosidad sanguinolenta.  · Presenta hemorragia vaginal.  · Presenta dolor abdominal constante.  · Siente un dolor en la parte baja de la espalda que nunca había sentido antes.  · Siente que el bebé empuja hacia abajo y le causa presión pélvica.  · El bebé no se mueve tanto como antes.  Document Released: 07/25/2005 Document Revised: 01/07/2012    ExitCare® Patient Information ©2014 ExitCare, LLC.

## 2013-06-10 ENCOUNTER — Ambulatory Visit (INDEPENDENT_AMBULATORY_CARE_PROVIDER_SITE_OTHER): Payer: No Typology Code available for payment source | Admitting: Family Medicine

## 2013-06-10 VITALS — BP 117/77 | Temp 98.5°F | Wt 127.0 lb

## 2013-06-10 DIAGNOSIS — Z3400 Encounter for supervision of normal first pregnancy, unspecified trimester: Secondary | ICD-10-CM

## 2013-06-10 DIAGNOSIS — Z3403 Encounter for supervision of normal first pregnancy, third trimester: Secondary | ICD-10-CM

## 2013-06-10 NOTE — Patient Instructions (Addendum)
Induccin del [place] de parto  (Labor Induction) La mayora de las mujeres comienza el trabajo de parto sin [place] las 37 y 42 semanas del Psychiatrist. Cuando esto no sucede, o cuando hay una necesidad mdica, pueden utilizarse medicamentos u otros mtodos para inducirlo. La induccin al parto hace que el tero de la mujer embarazada se [place]. Tambin hace que el cuello uterino se [place]lande (madure), se abra (se dilate), y se afine (se borre). Por lo general, el trabajo de parto no es inducido antes de las 39 semanas del embarazo excepto que haya un problema con el beb o de la [place].  Que en su caso sea inducido depende de ciertos factores, por ejemplo:   Su estado de salud y el de su beb.  Cuntas semanas tiene de [place].  El [place] de madurez de los pulmones del beb.  El estado del cuello uterino.  La posicin del beb. MOTIVOS PARA INDUCIR EL TRABAJO DE PARTO   La salud del beb o de la madre estn en riesgo.  El embarazo se ha pasado 1 semana o ms.  Se ha roto la bolsa de [place], pero el parto no empieza por s mismo.  La madre tiene un problema de salud o una enfermedad grave, como hipertensin arterial, infeccin, desprendimiento de la placenta o diabetes.  Hay poca cantidad de lquido amnitico alrededor del beb.  El beb tiene sufrimiento fetal. MOTIVOS PARA NO INDUCIR EL TRABAJO DE PARTO  La induccin del parto puede no ser Neomia Dear buena idea si:   Se observa que su beb no tolerar el trabajo de [place].  La induccin es slo ms conveniente.  Usted quiere que el beb nazca en una fecha determinada, como un da de [place].  Ha tenido cirugas previas en el tero, como una miomectoma o la extraccin de fibromas.  La placenta se encuentra muy baja en el tero y obstruye la abertura del cuello del tero (placenta previa).  El beb no est en posicin de cabeza.  El cordn umbilical cae por el canal del parto, frente al beb. En este caso podra cortarse el  suministro de sangre de oxgeno al beb.  Usted ha tenido antes un parto por cesrea.  Hay circunstancias inusuales, como que el beb sea [employer]. RIESGOS Y COMPLICACIONES  Pueden ocurrir problemas en el proceso de induccin y podr ser necesario modificar los planes segn desarrollo de los acontecimientos. Algunos de los riesgos de la induccin son:   Cambio en la frecuencia cardaca fetal, tales como que sea muy alta, muy baja, o errtica.  Riesgo de sufrimiento fetal.  Riesgo de infeccin para la madre y el beb.  Aumento de la posibilidad de tener un parto por cesrea.  Posibilidad rara, pero aumentada de que la placenta se separe del tero (desprendimiento).  Rotura uterina (muy raro). Cuando la induccin es necesaria por razones mdicas, los beneficios de la induccin pueden ser [employer].  ANTES DEL PROCEDIMIENTO  El mdico controlar el cuello y la posicin del beb. Esto lo ayudar a decidir si est en condiciones de una induccin al [employer].  PROCEDIMIENTO  Se pueden utilizar varios mtodos de induccin del [place], como:   Tomar medicamentos como prostaglandinas para dilatar y Customer service manager el cuello del tero. Este medicamento tambin iniciar las contracciones. Podr ser administrado por va oral o mediante la insercin de un supositorio en la vagina.  Podrn insertarle un tubo delgado (catter) con un globo en el extremo en la vagina para dilatar  el cuello del tero. Una vez insertado, el globo se expande con agua, lo que hace que el cuello uterino se abra.  Ruptura de las [place]. El mdico inserta un dedo entre el cuello del tero y las [place], lo que hace que el cuello del tero se estire y podr hacer que el tero se Technical sales engineer. Esto se suele hacer durante una visita al [facility]. Se la enviar a su casa a esperar a que las contracciones comiencen. Luego vendr para la induccin.  Ruptura de la bolsa de aguas. Su mdico har un Audiological scientist  amnitico con un instrumento pequeo. Una vez que se rompe el saco amnitico, las Therapist, occupational. Pueden pasar algunas horas para ver su efecto.  Tomar medicamentos para desencadenar o reforzar las contracciones. Este medicamento se administra por va intravenosa a travs de un tubo en el brazo. Todos los mtodos de induccin, adems la rotura de [place], se Futures trader hospital. La induccin se [employer], de modo que usted y el beb sean atentamente controlados.  DESPUS DEL PROCEDIMIENTO  Algunas inducciones pueden llevar hasta 2 o 3 das. Dependiendo del cuello del tero, por lo general toma menos tiempo. Se tarda ms tiempo cuando se induce al inicio del Psychiatrist o si es Contractor. Si la madre est todava embarazada y han realizado la induccin durante 2 a 3 das, ser [employer] sea [place] a su casa o que tenga un parto por Copy.  Document Released: 01/22/2008 Document Revised: 01/07/2012 [facility] Patient Information 2014 [place], [place].

## 2013-06-10 NOTE — Progress Notes (Signed)
22 yo G1 at 38.5 routine prenatal Having irregular contractions every 3-4 hours. Good fetal movement. No blood or fluid. Reports intermittent back pain  O: Flowsheet  A/P: Discussed labor and when to be eval at [facility]U Also discussed that we would consider induction of labor at 41 weeks; information given RTC in 1 week.

## 2013-06-15 ENCOUNTER — Inpatient Hospital Stay (HOSPITAL_COMMUNITY)
Admission: AD | Admit: 2013-06-15 | Discharge: 2013-06-17 | DRG: 774 | Disposition: A | Discharge status: Home / Self Care | Payer: Medicaid Other | Source: Ambulatory Visit | Attending: Obstetrics & Gynecology | Admitting: Obstetrics & Gynecology

## 2013-06-15 ENCOUNTER — Encounter (HOSPITAL_COMMUNITY): Payer: Self-pay | Admitting: *Deleted

## 2013-06-15 DIAGNOSIS — Z3400 Encounter for supervision of normal first pregnancy, unspecified trimester: Secondary | ICD-10-CM

## 2013-06-15 DIAGNOSIS — N10 Acute tubulo-interstitial nephritis: Secondary | ICD-10-CM

## 2013-06-15 DIAGNOSIS — B09 Unspecified viral infection characterized by skin and mucous membrane lesions: Secondary | ICD-10-CM

## 2013-06-15 DIAGNOSIS — N12 Tubulo-interstitial nephritis, not specified as acute or chronic: Secondary | ICD-10-CM | POA: Diagnosis present

## 2013-06-15 DIAGNOSIS — O864 Pyrexia of unknown origin following delivery: Secondary | ICD-10-CM | POA: Diagnosis not present

## 2013-06-15 DIAGNOSIS — O2390 Unspecified genitourinary tract infection in pregnancy, unspecified trimester: Principal | ICD-10-CM | POA: Diagnosis present

## 2013-06-15 LAB — TYPE AND SCREEN: Antibody Screen: NEGATIVE

## 2013-06-15 LAB — CBC
HCT: 37.6 % (ref 36.0–46.0)
Hemoglobin: 13.2 g/dL (ref 12.0–15.0)
MCHC: 35.1 g/dL (ref 30.0–36.0)
MCV: 84.5 fL (ref 78.0–100.0)
RDW: 13.4 % (ref 11.5–15.5)

## 2013-06-15 MED ORDER — LACTATED RINGERS IV SOLN
INTRAVENOUS | Status: DC
  Administered 2013-06-15: 125 mL/h via INTRAVENOUS

## 2013-06-15 MED ORDER — OXYCODONE-ACETAMINOPHEN 5-325 MG PO TABS: 1.0000 | ORAL_TABLET | ORAL | Status: DC | PRN

## 2013-06-15 MED ORDER — FENTANYL CITRATE 0.05 MG/ML IJ SOLN
100.0000 ug | INTRAMUSCULAR | Status: DC | PRN
  Administered 2013-06-15 (×2): 100 ug via INTRAVENOUS
  Filled 2013-06-15 (×3): qty 2

## 2013-06-15 MED ORDER — DIBUCAINE 1 % RE OINT: 1.0000 "application " | TOPICAL_OINTMENT | RECTAL | Status: DC | PRN

## 2013-06-15 MED ORDER — OXYTOCIN BOLUS FROM INFUSION: 500.0000 mL | INTRAVENOUS | Status: DC

## 2013-06-15 MED ORDER — ONDANSETRON HCL 4 MG/2ML IJ SOLN: 4.0000 mg | INTRAMUSCULAR | Status: DC | PRN

## 2013-06-15 MED ORDER — IBUPROFEN 600 MG PO TABS
600.0000 mg | ORAL_TABLET | Freq: Four times a day (QID) | ORAL | Status: DC
  Administered 2013-06-15 – 2013-06-17 (×8): 600 mg via ORAL
  Filled 2013-06-15 (×8): qty 1

## 2013-06-15 MED ORDER — LACTATED RINGERS IV SOLN: 500.0000 mL | INTRAVENOUS | Status: DC | PRN

## 2013-06-15 MED ORDER — LIDOCAINE HCL (PF) 1 % IJ SOLN
30.0000 mL | INTRAMUSCULAR | Status: DC | PRN
  Filled 2013-06-15 (×2): qty 30

## 2013-06-15 MED ORDER — PRENATAL MULTIVITAMIN CH
1.0000 | ORAL_TABLET | Freq: Every day | ORAL | Status: DC
  Administered 2013-06-16 – 2013-06-17 (×2): 1 via ORAL
  Filled 2013-06-15 (×2): qty 1

## 2013-06-15 MED ORDER — ONDANSETRON HCL 4 MG PO TABS: 4.0000 mg | ORAL_TABLET | ORAL | Status: DC | PRN

## 2013-06-15 MED ORDER — BENZOCAINE-MENTHOL 20-0.5 % EX AERO
1.0000 "application " | INHALATION_SPRAY | CUTANEOUS | Status: DC | PRN
  Administered 2013-06-16: 1 via TOPICAL
  Filled 2013-06-15: qty 56

## 2013-06-15 MED ORDER — ONDANSETRON HCL 4 MG/2ML IJ SOLN: 4.0000 mg | Freq: Four times a day (QID) | INTRAMUSCULAR | Status: DC | PRN

## 2013-06-15 MED ORDER — SIMETHICONE 80 MG PO CHEW: 80.0000 mg | CHEWABLE_TABLET | ORAL | Status: DC | PRN

## 2013-06-15 MED ORDER — MEASLES, MUMPS & RUBELLA VAC ~~LOC~~ INJ: 0.5000 mL | INJECTION | Freq: Once | SUBCUTANEOUS | Status: DC

## 2013-06-15 MED ORDER — ACETAMINOPHEN 325 MG PO TABS: 650.0000 mg | ORAL_TABLET | ORAL | Status: DC | PRN

## 2013-06-15 MED ORDER — TETANUS-DIPHTH-ACELL PERTUSSIS 5-2.5-18.5 LF-MCG/0.5 IM SUSP: 0.5000 mL | Freq: Once | INTRAMUSCULAR | Status: DC

## 2013-06-15 MED ORDER — OXYTOCIN 40 UNITS IN LACTATED RINGERS INFUSION - SIMPLE MED
62.5000 mL/h | INTRAVENOUS | Status: DC
  Administered 2013-06-15: 62.5 mL/h via INTRAVENOUS
  Filled 2013-06-15: qty 1000

## 2013-06-15 MED ORDER — SENNOSIDES-DOCUSATE SODIUM 8.6-50 MG PO TABS
2.0000 | ORAL_TABLET | Freq: Every day | ORAL | Status: DC
  Administered 2013-06-15 – 2013-06-16 (×2): 2 via ORAL

## 2013-06-15 MED ORDER — CITRIC ACID-SODIUM CITRATE 334-500 MG/5ML PO SOLN: 30.0000 mL | ORAL | Status: DC | PRN

## 2013-06-15 MED ORDER — ZOLPIDEM TARTRATE 5 MG PO TABS: 5.0000 mg | ORAL_TABLET | Freq: Every evening | ORAL | Status: DC | PRN

## 2013-06-15 MED ORDER — LANOLIN HYDROUS EX OINT: TOPICAL_OINTMENT | CUTANEOUS | Status: DC | PRN

## 2013-06-15 MED ORDER — FLEET ENEMA 7-19 GM/118ML RE ENEM: 1.0000 | ENEMA | RECTAL | Status: DC | PRN

## 2013-06-15 MED ORDER — DIPHENHYDRAMINE HCL 25 MG PO CAPS: 25.0000 mg | ORAL_CAPSULE | Freq: Four times a day (QID) | ORAL | Status: DC | PRN

## 2013-06-15 MED ORDER — IBUPROFEN 600 MG PO TABS
600.0000 mg | ORAL_TABLET | Freq: Four times a day (QID) | ORAL | Status: DC | PRN
  Administered 2013-06-15: 600 mg via ORAL
  Filled 2013-06-15: qty 1

## 2013-06-15 MED ORDER — WITCH HAZEL-GLYCERIN EX PADS: 1.0000 "application " | MEDICATED_PAD | CUTANEOUS | Status: DC | PRN

## 2013-06-15 MED ORDER — OXYCODONE-ACETAMINOPHEN 5-325 MG PO TABS
1.0000 | ORAL_TABLET | ORAL | Status: DC | PRN
  Administered 2013-06-15 – 2013-06-16 (×2): 1 via ORAL
  Filled 2013-06-15 (×2): qty 1

## 2013-06-15 MED ORDER — FENTANYL CITRATE 0.05 MG/ML IJ SOLN
50.0000 ug | INTRAMUSCULAR | Status: DC | PRN
  Administered 2013-06-15: 50 ug via INTRAVENOUS
  Filled 2013-06-15: qty 2

## 2013-06-15 NOTE — MAU Note (Signed)
Contractions this morning. Denies LOF. Some bloody show. Positive fetal movement. Goes to [facility].

## 2013-06-15 NOTE — MAU Note (Signed)
Dr Reola Calkins at bedside, ultrasound confirms vertex presentation

## 2013-06-15 NOTE — H&P (Signed)
Susan Freeman is a 22 y.o. female presenting for regular contractions. Stated they started in the middle of the night.  She presented to MAU and with walking, changed her cervix from 4 to 6cm.  No VB, LOF.  +FM.   Pt received her PNC at the [facility].  She has been there since 18 weeks. Pregnancy care was uncomplicated. 1 hour of 100. Normal anatomy US. GBS neg.    History OB History   Grav Para Term Preterm Abortions TAB SAB Ect Mult Living   1              History reviewed. No pertinent past medical history. History reviewed. No pertinent past surgical history. Family History: family history is not on file. Social History:  reports that she has never smoked. She does not have any smokeless tobacco history on file. She reports that she does not drink alcohol or use illicit drugs.   Review of Systems  All other systems reviewed and are negative.    Dilation: 6 Effacement (%): 100 Station: Ballotable Exam by:: Susan Morris RN/ Dr Susan Freeman Blood pressure 124/81, pulse 62, temperature 98.1 F (36.7 C), temperature source Oral, resp. rate 18, height 4' 10.27" (1.48 m), weight 58.786 kg (129 lb 9.6 oz), last menstrual period 09/12/2012. Maternal Exam:  Uterine Assessment: Contraction strength is moderate.  Contraction frequency is regular.   Pelvis: adequate for delivery.      Fetal Exam Fetal Monitor Review: Baseline rate: 125.  Variability: moderate (6-25 bpm).   Pattern: accelerations present and no decelerations.    Fetal State Assessment: Category I - tracings are normal.     Physical Exam  Constitutional: She is oriented to person, place, and time. She appears well-developed and well-nourished.  HENT:  Head: Normocephalic and atraumatic.  Eyes: EOM are normal.  Neck: Neck supple.  Cardiovascular: Normal rate.   Respiratory: Effort normal.  Musculoskeletal: She exhibits no edema.  Neurological: She is alert and oriented to person, place, and time.  Skin:  Skin is warm and dry.  Psychiatric: She has a normal mood and affect.    Prenatal labs: ABO, Rh: B/POS/-- (02/06 1350) Antibody: NEG (02/06 1350) Rubella: 1.57 (02/06 1350) RPR: NON REAC (06/10 1056)  HBsAg: NEGATIVE (02/06 1350)  HIV: NON REACTIVE (06/10 1056)  GBS: NEGATIVE (07/31 1023)   Assessment/Plan: 22 yo G1 @ [redacted]w[redacted]d by LMP cw 20wk [place] who presented in labor to the MAU.   **LABOR - admit to L&D - routine orders - epidural prn although pt currently just wanting IV meds. Fentanyl prn.  **FWB  - category I tracing  - Group B strep negative - EFW 6.5#  **Anticipate SVD. Family Medicine resident, Dr. Mikel Freeman, notified of admission.      Susan Freeman L 06/15/2013, 12:30 PM

## 2013-06-15 NOTE — MAU Note (Signed)
uc's since 3am

## 2013-06-15 NOTE — MAU Note (Signed)
Plan of care d/w V. Smith. Pt to walk an return to MAU in one hour. Interpreter paged

## 2013-06-15 NOTE — Progress Notes (Signed)
Patient ID: Susan Freeman, female   DOB: Oct 13, 1991, 22 y.o.   MRN: [id number]  Susan Freeman is a 22 y.o. G1P0 at [redacted]w[redacted]d  Subjective: (Interpreter present for entire interview and physical) Overall doing well. Continues to have a lot of discomfort with contractions. Feeling some pressure.   Objective: BP 112/75  Pulse 81  Temp(Src) 97.8 F (36.6 C) (Oral)  Resp 18  Ht 4' 10.27" (1.48 m)  Wt 129 lb 9.6 oz (58.786 kg)  BMI 26.84 kg/m2  LMP 09/12/2012     FHT:  FHR: 140's bpm, variability: moderate,  accelerations:   ,  decelerations:  Absent UC:   regular, every 2-4 minutes SVE:   Dilation: 6.5 Effacement (%): 100 Station: 0 Exam by:: dr. Mikel Cella  Labs: Lab Results  Component Value Date   WBC 10.3 06/15/2013   HGB 13.2 06/15/2013   HCT 37.6 06/15/2013   MCV 84.5 06/15/2013   PLT 312 06/15/2013    Assessment / Plan: Spontaneous labor, progressing normally  Labor: Progressing normally and AROM Preeclampsia:  n/a Fetal Wellbeing:  Category I Pain Control:  Fentanyl I/D:  n/a Anticipated MOD:  NSVD  Adamarie Izzo 06/15/2013, 1:37 PM

## 2013-06-16 DIAGNOSIS — M79609 Pain in unspecified limb: Secondary | ICD-10-CM

## 2013-06-16 DIAGNOSIS — M7989 Other specified soft tissue disorders: Secondary | ICD-10-CM

## 2013-06-16 DIAGNOSIS — N10 Acute tubulo-interstitial nephritis: Secondary | ICD-10-CM

## 2013-06-16 LAB — CBC WITH DIFFERENTIAL/PLATELET
Basophils Absolute: 0 10*3/uL (ref 0.0–0.1)
Basophils Relative: 0 % (ref 0–1)
HCT: 27.3 % — ABNORMAL LOW (ref 36.0–46.0)
Hemoglobin: 9.4 g/dL — ABNORMAL LOW (ref 12.0–15.0)
Lymphocytes Relative: 11 % — ABNORMAL LOW (ref 12–46)
Monocytes Absolute: 1 10*3/uL (ref 0.1–1.0)
Monocytes Relative: 8 % (ref 3–12)
Neutro Abs: 9.8 10*3/uL — ABNORMAL HIGH (ref 1.7–7.7)
Neutrophils Relative %: 81 % — ABNORMAL HIGH (ref 43–77)
RDW: 14 % (ref 11.5–15.5)
WBC: 12.2 10*3/uL — ABNORMAL HIGH (ref 4.0–10.5)

## 2013-06-16 LAB — URINALYSIS, ROUTINE W REFLEX MICROSCOPIC
Bilirubin Urine: NEGATIVE
Nitrite: NEGATIVE
Specific Gravity, Urine: 1.01 (ref 1.005–1.030)
Urobilinogen, UA: 0.2 mg/dL (ref 0.0–1.0)
pH: 6 (ref 5.0–8.0)

## 2013-06-16 LAB — URINE MICROSCOPIC-ADD ON

## 2013-06-16 MED ORDER — CEPHALEXIN 500 MG PO CAPS
500.0000 mg | ORAL_CAPSULE | Freq: Four times a day (QID) | ORAL | Status: DC
  Administered 2013-06-16 – 2013-06-17 (×5): 500 mg via ORAL
  Filled 2013-06-16 (×12): qty 1

## 2013-06-16 NOTE — Progress Notes (Signed)
Post Partum Day 1 Subjective: no complaints, up ad lib, voiding and tolerating PO pain is well controlled  Objective: Blood pressure 93/50, pulse 64, temperature 97.9 F (36.6 C), temperature source Oral, resp. rate 19, height 4' 10.27" (1.48 m), weight 129 lb 9.6 oz (58.786 kg), last menstrual period 09/12/2012, SpO2 98.00%, unknown if currently breastfeeding.  Physical Exam:  General: alert, cooperative and no distress Lochia: appropriate Uterine Fundus: firm Incision: n/a DVT Evaluation: No evidence of DVT seen on physical exam.   Recent Labs  06/15/13 1105  HGB 13.2  HCT 37.6    Assessment/Plan: Plan for discharge tomorrow and Lactation consult. Still undecided about contraception. Will follow up with [facility].   LOS: 1 day   HAIRFORD, AMBER 06/16/2013, 8:18 AM   Evaluation and management procedures were performed by Resident physician under my supervision/collaboration. Chart reviewed, patient examined by me and I agree with management and plan. Probably will do IUD. Danae Orleans, CNM 06/16/2013 9:59 AM

## 2013-06-16 NOTE — Progress Notes (Signed)
Patient ID: Susan Freeman, female   DOB: 1991/05/02, 22 y.o.   MRN: [id number] Addendum to Dr. Atilano Median note. Saw pt after Dr. Mal Misty and reviewed neg LE Doppler studies. Results for orders placed during the hospital encounter of 06/15/13 (from the past 24 hour(s))  URINALYSIS, ROUTINE W REFLEX MICROSCOPIC     Status: Abnormal   Collection Time    06/16/13  4:35 PM      Result Value Range   Color, Urine YELLOW  YELLOW   APPearance CLEAR  CLEAR   Specific Gravity, Urine 1.010  1.005 - 1.030   pH 6.0  5.0 - 8.0   Glucose, UA NEGATIVE  NEGATIVE mg/dL   Hgb urine dipstick LARGE (*) NEGATIVE   Bilirubin Urine NEGATIVE  NEGATIVE   Ketones, ur NEGATIVE  NEGATIVE mg/dL   Protein, ur NEGATIVE  NEGATIVE mg/dL   Urobilinogen, UA 0.2  0.0 - 1.0 mg/dL   Nitrite NEGATIVE  NEGATIVE   Leukocytes, UA MODERATE (*) NEGATIVE  URINE MICROSCOPIC-ADD ON     Status: Abnormal   Collection Time    06/16/13  4:35 PM      Result Value Range   Squamous Epithelial / LPF FEW (*) RARE   WBC, UA 21-50  <3 WBC/hpf   RBC / HPF 11-20  <3 RBC/hpf   Bacteria, UA MANY (*) RARE   Urine-Other MUCOUS PRESENT    CBC WITH DIFFERENTIAL     Status: Abnormal   Collection Time    06/16/13  4:54 PM      Result Value Range   WBC 12.2 (*) 4.0 - 10.5 K/uL   RBC 3.18 (*) 3.87 - 5.11 MIL/uL   Hemoglobin 9.4 (*) 12.0 - 15.0 g/dL   HCT 40.9 (*) 81.1 - 91.4 %   MCV 85.8  78.0 - 100.0 fL   MCH 29.6  26.0 - 34.0 pg   MCHC 34.4  30.0 - 36.0 g/dL   RDW 78.2  95.6 - 21.3 %   Platelets 221  150 - 400 K/uL   Neutrophils Relative % 81 (*) 43 - 77 %   Neutro Abs 9.8 (*) 1.7 - 7.7 K/uL   Lymphocytes Relative 11 (*) 12 - 46 %   Lymphs Abs 1.4  0.7 - 4.0 K/uL   Monocytes Relative 8  3 - 12 %   Monocytes Absolute 1.0  0.1 - 1.0 K/uL   Eosinophils Relative 0  0 - 5 %   Eosinophils Absolute 0.0  0.0 - 0.7 K/uL   Basophils Relative 0  0 - 1 %   Basophils Absolute 0.0  0.0 - 0.1 K/uL   Filed Vitals:   06/15/13 2340 06/16/13 0620  06/16/13 1401 06/16/13 1635  BP: 104/58 93/50 106/71   Pulse: 79 64 100   Temp: 98.7 F (37.1 C) 97.9 F (36.6 C) 102 F (38.9 C) 100.5 F (38.1 C)  TempSrc: Oral Oral Oral Oral  Resp: 18 19 18    Height:      Weight:      SpO2:  98%    Gen: NAD Breasts soft, not inflamed Skin: warm, dry Fundus NT.  Back: No CVAT.  ASSESSMENT: UTI  PLAN: Start Keflex. Tylenol for fever. Danae Orleans, CNM 06/16/2013 5:36 PM

## 2013-06-16 NOTE — H&P (Signed)
Attestation of Attending Supervision of Advanced Practitioner (CNM/NP): Evaluation and management procedures were performed by the Advanced Practitioner under my supervision and collaboration.  I have reviewed the Advanced Practitioner's note and chart, and I agree with the management and plan.  Ricki Vanhandel 06/16/2013 6:47 AM   

## 2013-06-16 NOTE — Progress Notes (Signed)
Interpreter in room with pt and RN. Discussed MD's orders for urine culture and ultrasound of leg for possible blood clot. Interpreter explained to pt the MD would like a catheter sample of urine for a urine culture, and the pt said she would rather urinate in the cup and not do the catheter. Also, explained the RN would be completing a PKU after 1630 on the baby and explained the procedure.

## 2013-06-16 NOTE — Progress Notes (Signed)
Ur chart review completed.  

## 2013-06-16 NOTE — Progress Notes (Signed)
VASCULAR LAB PRELIMINARY  PRELIMINARY  PRELIMINARY  PRELIMINARY  Left lower extremity venous duplex completed.    Preliminary report:  Left:  No evidence of DVT, superficial thrombosis, or Baker's cyst.  Adisa Litt, RVS 06/16/2013, 4:59 PM

## 2013-06-16 NOTE — Progress Notes (Signed)
Patient ID: Susan Freeman, female   DOB: Oct 08, 1991, 21 y.o.   MRN: [id number]  Assessment and Plan:  Pyelonephritis - patient has fever with UTI. Has symptoms and urinalysis suggestive of infection. She has a white count at 12.2. - give ceftriaxone 1g BID for empiric treatment - tylenol for fever  Left calf tenderness - signs of DVT on history and exam. Currently hypercoagulable state. Venous duplex ordered and negative  Subjective: She's been having some pain in her leg that started today. She has also had burning with urination once today, which subsided later. She has no chest pain, shortness of breath, abdominal pain. No chills or sweats. Does feel feverish and weak.  Objective: Temp:  [97.9 F (36.6 C)-102 F (38.9 C)] 102 F (38.9 C) (08/19 1401) Pulse Rate:  [64-105] 100 (08/19 1401) Resp:  [18-20] 18 (08/19 1401) BP: (93-130)/(50-88) 106/71 mmHg (08/19 1401) SpO2:  [97 %-98 %] 98 % (08/19 0620)  Physical Exam: General: No acute distress Cardiovascular: RRR, no murmur Respiratory: clear to auscultation Abdomen: soft, mild suprapubic tenderness Extremities: left calf tender, warm, no erythema, holman sign positive, no appreciable edema.  Laboratory:  Recent Labs Lab 06/15/13 1105 06/16/13 1654  WBC 10.3 12.2*  HGB 13.2 9.4*  HCT 37.6 27.3*  PLT 312 221   Urinalysis    Component Value Date/Time   COLORURINE YELLOW 06/16/2013 1635   APPEARANCEUR CLEAR 06/16/2013 1635   LABSPEC 1.010 06/16/2013 1635   PHURINE 6.0 06/16/2013 1635   GLUCOSEU NEGATIVE 06/16/2013 1635   HGBUR LARGE* 06/16/2013 1635   BILIRUBINUR NEGATIVE 06/16/2013 1635   BILIRUBINUR NEG 01/28/2013 1229   KETONESUR NEGATIVE 06/16/2013 1635   PROTEINUR NEGATIVE 06/16/2013 1635   UROBILINOGEN 0.2 06/16/2013 1635   UROBILINOGEN 0.2 01/28/2013 1229   NITRITE NEGATIVE 06/16/2013 1635   NITRITE NEG 01/28/2013 1229   LEUKOCYTESUR MODERATE* 06/16/2013 1635   Imaging/Diagnostic Tests: No evidence  DVT  Jacquelin Hawking, MD 06/16/2013, 3:35 PM PGY-1

## 2013-06-17 ENCOUNTER — Encounter: Payer: No Typology Code available for payment source | Admitting: Family Medicine

## 2013-06-17 DIAGNOSIS — O864 Pyrexia of unknown origin following delivery: Secondary | ICD-10-CM

## 2013-06-17 DIAGNOSIS — O2390 Unspecified genitourinary tract infection in pregnancy, unspecified trimester: Secondary | ICD-10-CM

## 2013-06-17 DIAGNOSIS — N12 Tubulo-interstitial nephritis, not specified as acute or chronic: Secondary | ICD-10-CM

## 2013-06-17 MED ORDER — IBUPROFEN 600 MG PO TABS: 600.0000 mg | ORAL_TABLET | Freq: Four times a day (QID) | ORAL | Status: DC

## 2013-06-17 MED ORDER — CEPHALEXIN 500 MG PO CAPS: 500.0000 mg | ORAL_CAPSULE | Freq: Four times a day (QID) | ORAL | Status: DC

## 2013-06-17 NOTE — Progress Notes (Signed)
Explained safe sleep to mother and also removed baby from bed. Mother still placed infant in the bed surrounded by pillows and blankets.

## 2013-06-17 NOTE — Discharge Summary (Signed)
I spoke with and examined patient and agree with resident's note and plan of care.  Tawana Scale, MD OB Fellow 06/17/2013 10:07 PM

## 2013-06-17 NOTE — Progress Notes (Signed)
Post Partum Day 2 Subjective: no complaints, up ad lib, voiding and tolerating PO Febrile yesterday to Tmax 102. Last temp was at 1635. Started on Keflex for pyelo, and feeling better today  Objective: Blood pressure 110/76, pulse 80, temperature 98.8 F (37.1 C), temperature source Oral, resp. rate 18, height 4' 10.27" (1.48 m), weight 129 lb 9.6 oz (58.786 kg), last menstrual period 09/12/2012, SpO2 98.00%, unknown if currently breastfeeding.  Physical Exam:  General: alert, cooperative and no distress Lochia: appropriate Uterine Fundus: firm Incision: n/a DVT Evaluation: No evidence of DVT seen on physical exam.   Recent Labs  06/15/13 1105 06/16/13 1654  HGB 13.2 9.4*  HCT 37.6 27.3*    Assessment/Plan: Discharge home when afebrile for 24 hours (after 1635 today). Pt updated on plan with interpreter. Continue Keflex for UTI Continue to encourage breast feeding Follow up at [facility]   LOS: 2 days   Susan Freeman 06/17/2013, 8:26 AM

## 2013-06-17 NOTE — Progress Notes (Signed)
Exam in presence of [name].    I spoke with and examined patient and agree with resident's note and plan of care.  Tawana Scale, MD OB Fellow 06/17/2013 4:54 PM

## 2013-06-17 NOTE — Discharge Summary (Signed)
Obstetric Discharge Summary Reason for Admission: onset of labor Prenatal Procedures: ultrasound Intrapartum Procedures: spontaneous vaginal delivery Postpartum Procedures: none Complications-Operative and Postpartum: PP fever; dx pyelonephritis tx with Keflex. Neg for DVT Hemoglobin  Date Value Range Status  06/16/2013 9.4* 12.0 - 15.0 g/dL Final     DELTA CHECK NOTED     REPEATED TO VERIFY     SPECIMEN CHECKED FOR CLOTS     HCT  Date Value Range Status  06/16/2013 27.3* 36.0 - 46.0 % Final    Physical Exam:  General: alert, cooperative and no distress Lochia: appropriate Uterine Fundus: firm Incision: healing well DVT Evaluation: No evidence of DVT seen on physical exam.  Discharge Diagnoses: Term Pregnancy-delivered  Discharge Information: Date: 06/17/2013 Activity: pelvic rest Diet: routine Medications: Ibuprofen and Keflex for UTI Condition: stable Instructions: refer to practice specific booklet Discharge to: home, follow up with [facility] Follow-up Information   Follow up with Rodman Pickle, MD.   Specialty:  Family Medicine   Contact information:   [address] [place] [address] [phone]       Newborn Data: Live born female  Birth Weight: 7 lb 1.1 oz (3205 g) APGAR: 9, 9  Home with mother.  Jacquelin Hawking, MD  06/17/2013, 5:02 PM

## 2013-06-19 LAB — URINE CULTURE

## 2013-07-01 ENCOUNTER — Ambulatory Visit (INDEPENDENT_AMBULATORY_CARE_PROVIDER_SITE_OTHER): Payer: Self-pay | Admitting: Family Medicine

## 2013-07-01 ENCOUNTER — Encounter: Payer: Self-pay | Admitting: Family Medicine

## 2013-07-01 VITALS — BP 104/68 | HR 65 | Temp 98.1°F | Ht <= 58 in | Wt 106.0 lb

## 2013-07-01 DIAGNOSIS — L739 Follicular disorder, unspecified: Secondary | ICD-10-CM | POA: Insufficient documentation

## 2013-07-01 DIAGNOSIS — L738 Other specified follicular disorders: Secondary | ICD-10-CM

## 2013-07-01 MED ORDER — SULFAMETHOXAZOLE-TRIMETHOPRIM 800-160 MG PO TABS: 1.0000 | ORAL_TABLET | Freq: Two times a day (BID) | ORAL | Status: DC

## 2013-07-01 NOTE — Patient Instructions (Addendum)
I have given you an antibiotic. Take it for one week.  Please come back if you are not better next Wednesday.  Amber M. Hairford, M.D.

## 2013-07-01 NOTE — Assessment & Plan Note (Signed)
Left sided pustule most consistent with folliculitis, however the bump on the right could either be folliculitis, early abscess or developing bartholin's cyst. Will treat with Septra x1 week and recheck. May need to I&D cyst if does not resolve.

## 2013-07-01 NOTE — Progress Notes (Signed)
Patient ID: Susan Freeman, female   DOB: [birth year], 21 y.o.   MRN: [id number]  [facility] Bradie Sangiovanni M. Etola Mull, MD Phone: [phone]   Subjective: HPI: Patient is a 22 y.o. female presenting to clinic today for same day appointment; 2 weeks post-partum. Interpreter present for entire history and exam  Concerned about 2 bumps inside vagina x1 week. Painful. No bleeding no bruising. Never had anything like this before. No vaginal discharge, normal lochia. Does report abdominal pain, but improving since delivery. Was on Keflex, but finished Rx. Has some burning.   History Reviewed: Non smoker.  ROS: Please see HPI above.  Objective: Office vital signs reviewed. BP 104/68  Pulse 65  Temp(Src) 98.1 F (36.7 C) (Oral)  Ht 4\' 10"  (1.473 m)  Wt 106 lb (48.081 kg)  BMI 22.16 kg/m2  Physical Examination:  General: Awake, alert. NAD Pulm: CTAB, no wheezes Cardio: RRR, no murmurs appreciated Abdomen:+BS, soft, nontender, nondistended GU: One pustule on left external labia, not very tender. One 1cm indurated papule on lower right external labia as well. No pus or weeping. Very tender to palpation  Assessment: 22 y.o. female with folliculitis and possible bartholin's cyst  Plan: See Problem List and After Visit Summary

## 2013-07-17 ENCOUNTER — Ambulatory Visit: Payer: Self-pay

## 2013-07-29 ENCOUNTER — Ambulatory Visit (INDEPENDENT_AMBULATORY_CARE_PROVIDER_SITE_OTHER): Payer: Self-pay | Admitting: Family Medicine

## 2013-07-29 ENCOUNTER — Encounter: Payer: Self-pay | Admitting: Family Medicine

## 2013-07-29 MED ORDER — NORETHINDRONE 0.35 MG PO TABS: 1.0000 | ORAL_TABLET | Freq: Every day | ORAL | Status: DC

## 2013-07-29 NOTE — Patient Instructions (Signed)
  Informacin sobre el dispositivo intrauterino  (Intrauterine Device Information) El dispositivo intrauterino (DIU) se inserta en el tero e impide el embarazo. Hay dos tipos de DIU:   DIU de cobre. Este tipo de DIU est recubierto con un alambre de cobre y se inserta dentro del tero. El cobre hace que el tero y las trompas de [place] produzcan un liquido que [employer] espermatozoides. El DIU de cobre puede Geneticist, molecular durante 10 aos.  DIU hormonal. Este tipo de DIU contiene la hormona progestina (progesterona sinttica). La hormona espesa el moco cervical y evita que los espermatozoides ingresen al tero y tambin afina la membrana que cubre el tero para evitar la implantacin del vulo fertilizado. La hormona debilita o destruye los espermatozoides que ingresan al tero. El DIU hormonal puede Geneticist, molecular durante 5 aos. El mdico se asegurar de que usted es una buena candidata para usar el DIU cono anticonceptivo. Hable con su mdico acerca de los posibles efectos secundarios.  VENTAJAS  Es muy eficaz, reversible, de accin prolongada y de bajo mantenimiento.  No hay efectos secundarios relacionados con el estrgeno.  El DIU puede ser utilizado durante la Market researcher.  No est asociado con el aumento de [place].  Funciona inmediatamente despus de la insercin.  El DIU de cobre no interfiere con las hormonas femeninas.  El DIU con progesterona puede hacer que los perodos menstruales no sean tan abundantes.  El DIU de progesterona puede usarse durante 5 aos.  El DIU de cobre puede usarse durante 10 aos. DESVENTAJAS  El DIU de progesterona puede estar asociado con patrones de sangrado irregular.  El DIU de cobre puede hacer que el flujo menstrual ms abundante y doloroso.  Puede experimentar clicos y sangrado vaginal despus de la insercin. Document Released: 04/04/2010 Document Revised: 01/07/2012 [facility] Patient Information 2014 [place],  [place].

## 2013-07-29 NOTE — Progress Notes (Signed)
  Subjective:     Susan Freeman is a 22 y.o. female who presents for a postpartum visit. She is 6 weeks postpartum following a spontaneous vaginal delivery. I have fully reviewed the prenatal and intrapartum course. The delivery was at 39 gestational weeks. Outcome: spontaneous vaginal delivery. Anesthesia: none. Postpartum course has been unremarkable. Did have external cysts vs folliculitis of the labia which has resolved. Baby's course has been going well; does have an umbilical granuloma pending pediatric surgery referral. Baby is feeding by breast. Bleeding no bleeding. Bowel function is normal. Bladder function is normal. Patient is not sexually active. Contraception method is IUD; will schedule appointment. Postpartum depression screening: negative.  The following portions of the patient's history were reviewed and updated as appropriate: allergies, current medications, past family history, past medical history, past social history, past surgical history and problem list.  Review of Systems Pertinent items are noted in HPI.   Objective:    BP 105/64  Pulse 73  Temp(Src) 98.3 F (36.8 C) (Oral)  Wt 111 lb (50.349 kg)  BMI 23.21 kg/m2  Breastfeeding? Yes  General:  alert, cooperative and no distress   Breasts:  inspection negative, no nipple discharge or bleeding, no masses or nodularity palpable  Lungs: clear to auscultation bilaterally  Heart:  regular rate and rhythm, S1, S2 normal, no murmur, click, rub or gallop  Abdomen: soft, non-tender; bowel sounds normal; no masses,  no organomegaly        Assessment:     Normal postpartum exam. Pap smear not done at today's visit.   Plan:    1. Contraception: IUD, but since patient decided this today at her post-partum visit she must now wait for an IUD scholarship. Until then, minipill was prescribed. 2. Folliculitis has resolved, return as needed 3. Follow up in: a few months or as needed.

## 2013-07-29 NOTE — Progress Notes (Signed)
Interpreter Miyeko Mahlum Namihira for Dr Hairford 

## 2013-11-06 ENCOUNTER — Ambulatory Visit: Payer: No Typology Code available for payment source

## 2014-01-07 ENCOUNTER — Ambulatory Visit (INDEPENDENT_AMBULATORY_CARE_PROVIDER_SITE_OTHER): Payer: No Typology Code available for payment source | Admitting: Family Medicine

## 2014-01-07 ENCOUNTER — Encounter: Payer: Self-pay | Admitting: Family Medicine

## 2014-01-07 VITALS — BP 113/79 | HR 77 | Temp 98.4°F | Wt 109.0 lb

## 2014-01-07 DIAGNOSIS — N644 Mastodynia: Secondary | ICD-10-CM

## 2014-01-07 NOTE — Patient Instructions (Signed)
Try using heat, pumping and Tylenol for comfort.  Let me know if it does not improve.  Kamaria Lucia M. Ares Cardozo, M.D.

## 2014-01-08 NOTE — Progress Notes (Signed)
Patient ID: Carole CivilMarisela Hord, female   DOB: 04/23/1991, 23 y.o.   MRN: [id number]    Subjective: HPI: Patient is a 23 y.o. female presenting to clinic today for right breast pain. Interpreter used for entire visit.  Patient has has breast pain on right medial lower quadrant. No redness, no fever, no bloody nipple discharge, no obvious lumps. She is still breastfeeding but has greatly reduced milk production on the right side. She states pain is constant and nothing makes it better or worse. No family history of breast cancer. She has never had anything like this before. She has not tried pumping milk.  History Reviewed: Non smoker. Health Maintenance: Needs pap now over 23 yo  ROS: Please see HPI above.  Objective: Office vital signs reviewed. BP 113/79  Pulse 77  Temp(Src) 98.4 F (36.9 C) (Oral)  Wt 109 lb (49.442 kg)  Breastfeeding? Yes  Physical Examination:  General: Awake, alert. NAD HEENT: Atraumatic, normocephalic Breast: Right breast smaller and less firm than left. No redness or warmth. No lumps appreciated, but does have TTP on right breast at 5:00 position  Assessment: 23 y.o. female breast pain  Plan: See Problem List and After Visit Summary

## 2014-01-09 DIAGNOSIS — N644 Mastodynia: Secondary | ICD-10-CM | POA: Insufficient documentation

## 2014-01-09 NOTE — Assessment & Plan Note (Signed)
A: No red flags (fever, redness, lumps), I am concerned that she is having decreased milk production and perhaps this is some engorgement from decreased nursing on the right side. IT appears to be glandular in nature.  P: -Heat to area - NSAID for pain relief - Pump on right side to help stimulate milk - If does not resolve, consider ultrasound. - F/u prn

## 2014-02-05 IMAGING — US US OB DETAIL+14 WK
1 series · 12 of 28 positions shown · non-contrast
Comparison: none

[Series 1: us ob detail +14 wk · 12 of 75 slices shown]
[im 3/75]
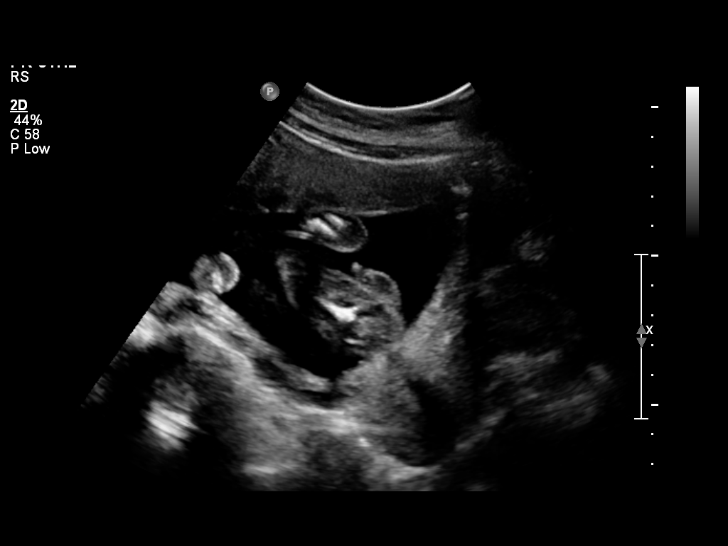
[im 9/75]
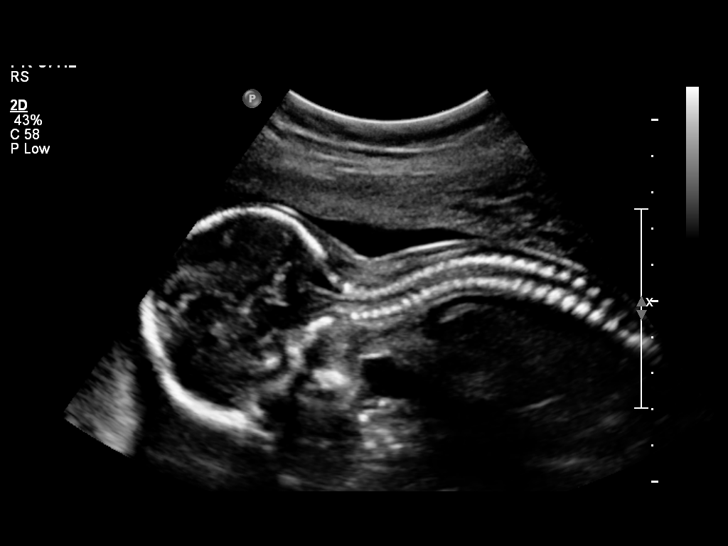
[im 14/75]
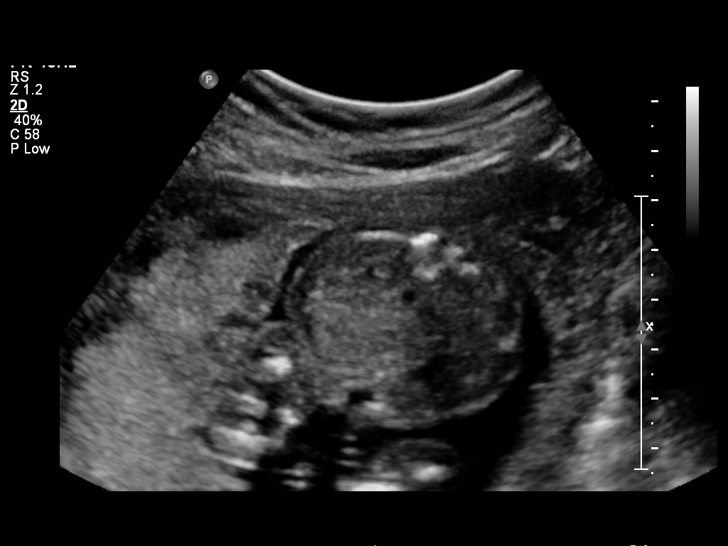
[im 22/75]
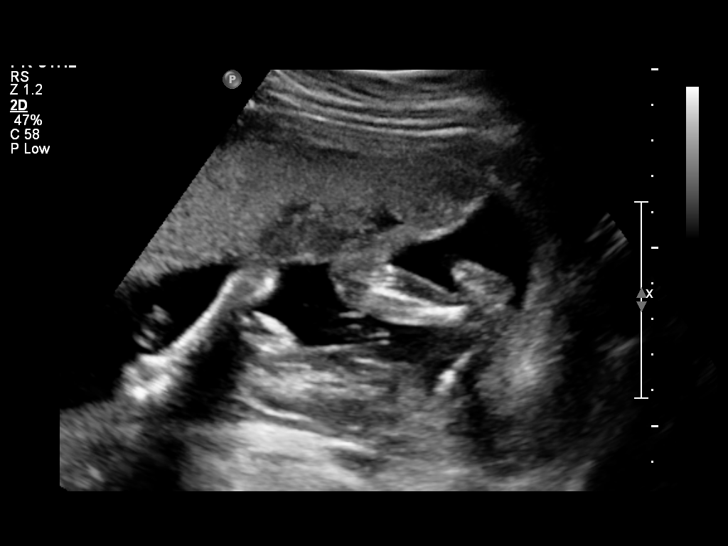
[im 28/75]
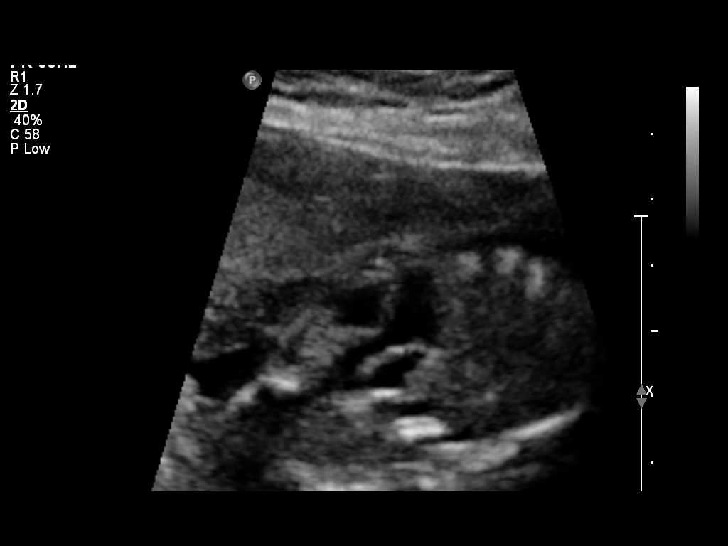
[im 33/75]
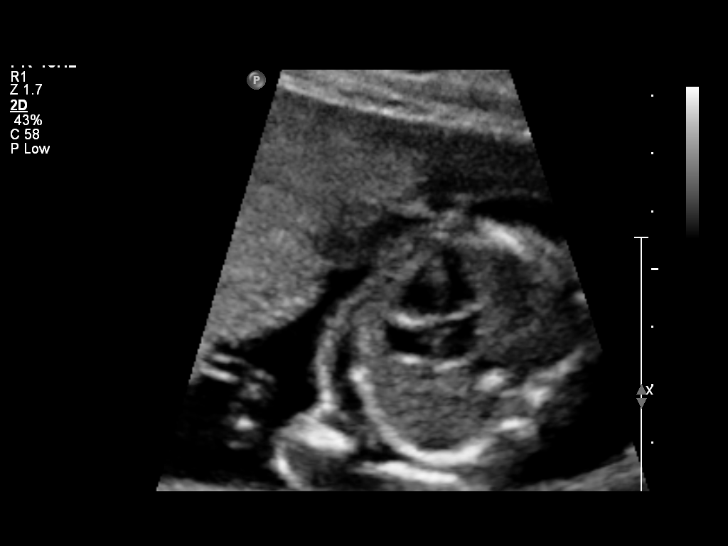
[im 42/75]
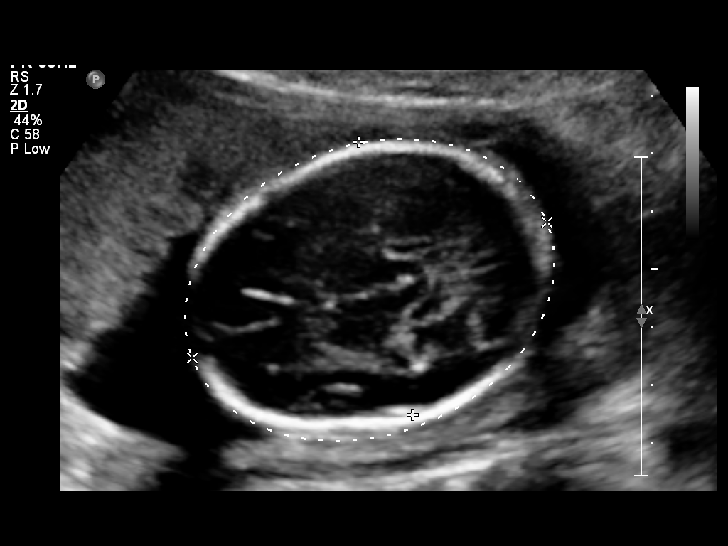
[im 47/75]
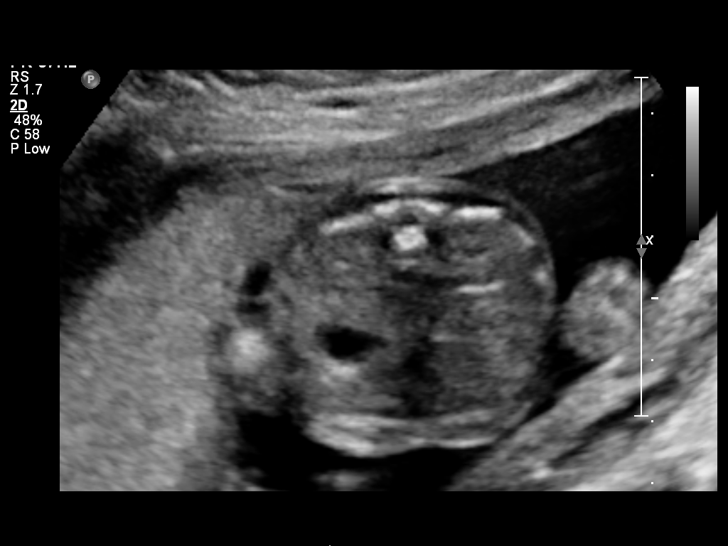
[im 53/75]
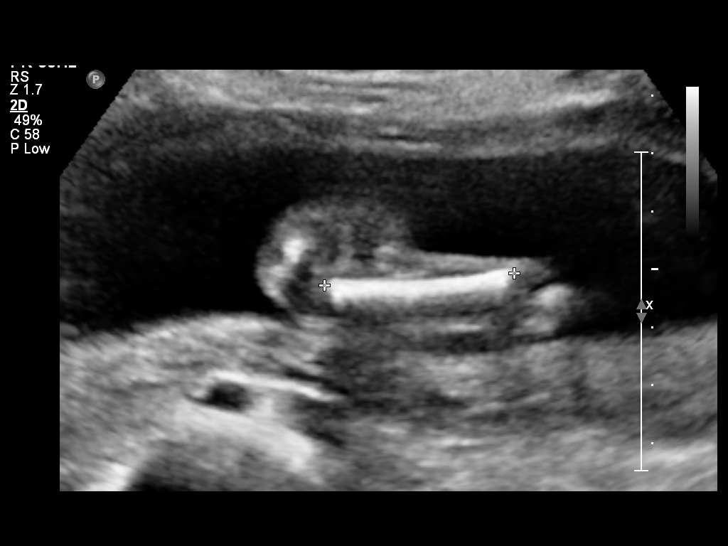
[im 61/75]
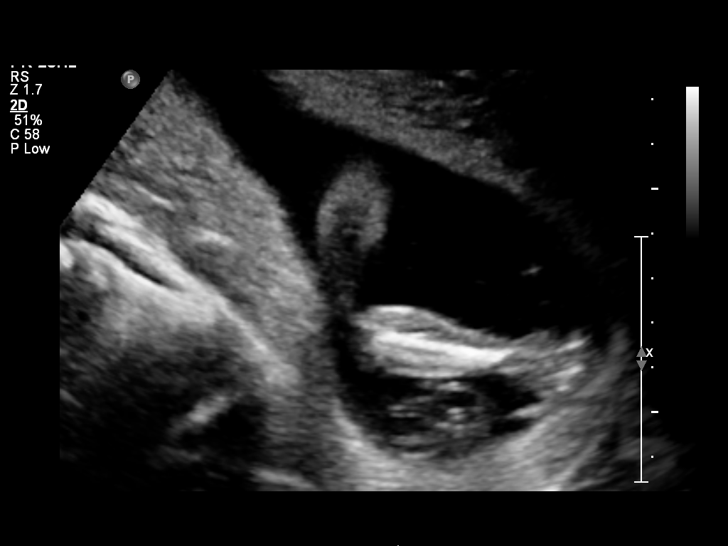
[im 66/75]
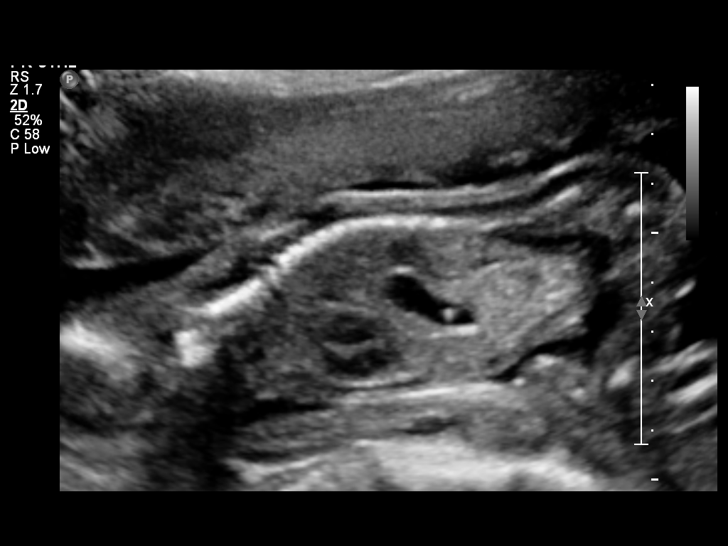
[im 72/75]
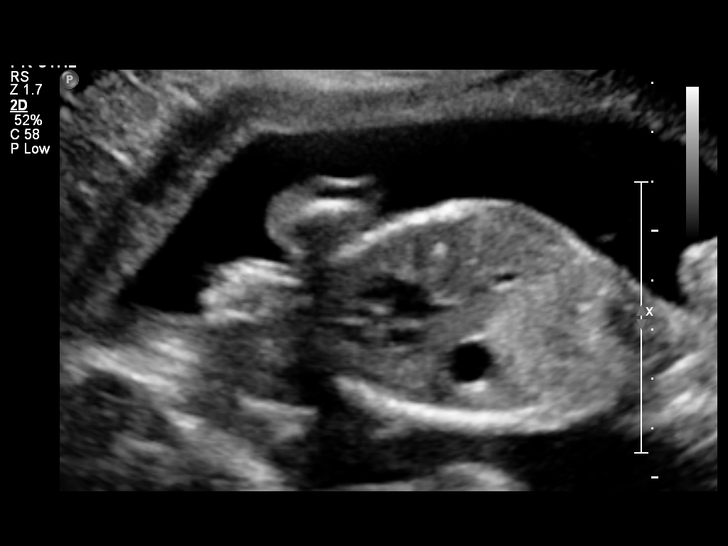

[12 of 28 positions shown; findings below may reference images not displayed]

OBSTETRICS REPORT
                      (Signed Final 02/02/2013 [DATE])

             [facility]

Service(s) Provided

 US OB DETAIL + 14 WK                                  76811.0
Indications

 Detailed fetal anatomic survey
Fetal Evaluation

 Num Of Fetuses:    1
 Fetal Heart Rate:  147                         bpm
 Cardiac Activity:  Observed
 Presentation:      Breech
 Placenta:          Anterior, above cervical os
 P. Cord            Visualized
 Insertion:

 Amniotic Fluid
 AFI FV:      Subjectively within normal limits
                                             Larg Pckt:     4.2  cm
Biometry

 BPD:     47.8  mm    G. Age:   20w 3d                CI:        70.04   70 - 86
                                                      FL/HC:      17.2   16.8 -

 HC:     182.2  mm    G. Age:   20w 4d       50  %    HC/AC:      1.23   1.09 -

 AC:     148.6  mm    G. Age:   20w 1d       34  %    FL/BPD:
 FL:      31.3  mm    G. Age:   19w 5d       20  %    FL/AC:      21.1   20 - 24
 HUM:     32.7  mm    G. Age:   21w 0d       66  %

 Est. FW:     327  gm    0 lb 12 oz      38  %
Gestational Age

 LMP:           20w 3d       Date:   09/12/12                 EDD:   06/19/13
 U/S Today:     20w 1d                                        EDD:   06/21/13
 Best:          20w 3d    Det. By:   LMP  (09/12/12)          EDD:   06/19/13
Anatomy

 Cranium:          Appears normal         Aortic Arch:      Appears normal
 Fetal Cavum:      Appears normal         Ductal Arch:      Appears normal
 Ventricles:       Appears normal         Diaphragm:        Appears normal
 Choroid Plexus:   Appears normal         Stomach:          Appears normal, left
                                                            sided
 Cerebellum:       Appears normal         Abdomen:          Appears normal
 Posterior Fossa:  Appears normal         Abdominal Wall:   Appears nml (cord
                                                            insert, abd wall)
 Nuchal Fold:      Not applicable (>20    Cord Vessels:     Appears normal (3
                   wks GA)                                  vessel cord)
 Face:             Orbits appear          Kidneys:          Appear normal
                   normal
 Lips:             Appears normal         Bladder:          Appears normal
 Heart:            Appears normal         Spine:            Appears normal
                   (4CH, axis, and
                   situs)
 RVOT:             Appears normal         Lower             Appears normal
                                          Extremities:
 LVOT:             Appears normal         Upper             Appears normal
                                          Extremities:

 Other:  Male gender. Heels visualized. Technically difficult due to fetal
         position.
Cervix Uterus Adnexa

 Cervical Length:   3.6       cm

 Cervix:       Normal appearance by transabdominal scan.
 Left Ovary:   Within normal limits.
 Right Ovary:  Not visualized.
 Adnexa:     No abnormality visualized.
Impression

 Single live IUP in breech presentation.   Concordant
 measurements/assigned GA by LMP.
 No anatomic abnormality seen with a good quality survey
 possible.

 SALAMONE with us.  Please do not hesitate to

## 2014-04-05 ENCOUNTER — Emergency Department (HOSPITAL_COMMUNITY)
Admission: EM | Admit: 2014-04-05 | Discharge: 2014-04-06 | Disposition: A | Discharge status: Home / Self Care | Payer: No Typology Code available for payment source | Attending: Emergency Medicine | Admitting: Emergency Medicine

## 2014-04-05 ENCOUNTER — Encounter (HOSPITAL_COMMUNITY): Payer: Self-pay | Admitting: Emergency Medicine

## 2014-04-05 DIAGNOSIS — Z79899 Other long term (current) drug therapy: Secondary | ICD-10-CM | POA: Insufficient documentation

## 2014-04-05 DIAGNOSIS — H00019 Hordeolum externum unspecified eye, unspecified eyelid: Secondary | ICD-10-CM | POA: Insufficient documentation

## 2014-04-05 DIAGNOSIS — Z792 Long term (current) use of antibiotics: Secondary | ICD-10-CM | POA: Insufficient documentation

## 2014-04-05 DIAGNOSIS — H00014 Hordeolum externum left upper eyelid: Secondary | ICD-10-CM

## 2014-04-05 DIAGNOSIS — L03213 Periorbital cellulitis: Secondary | ICD-10-CM

## 2014-04-05 DIAGNOSIS — H44009 Unspecified purulent endophthalmitis, unspecified eye: Secondary | ICD-10-CM | POA: Insufficient documentation

## 2014-04-05 MED ORDER — TETRACAINE HCL 0.5 % OP SOLN
2.0000 [drp] | Freq: Once | OPHTHALMIC | Status: AC
  Administered 2014-04-06: 2 [drp] via OPHTHALMIC
  Filled 2014-04-05: qty 2

## 2014-04-05 MED ORDER — FLUORESCEIN SODIUM 1 MG OP STRP
1.0000 | ORAL_STRIP | Freq: Once | OPHTHALMIC | Status: AC
  Administered 2014-04-06: 1 via OPHTHALMIC
  Filled 2014-04-05: qty 1

## 2014-04-05 NOTE — ED Notes (Signed)
Pt reports her L eye began to have swelling and redness starting Saturday night. Symptoms have been getting progressively worse since. Pt reports her vision is now being affected due to the swelling. Ax4, NAD.
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
## 2014-04-06 MED ORDER — CEPHALEXIN 500 MG PO CAPS: 500.0000 mg | ORAL_CAPSULE | Freq: Four times a day (QID) | ORAL | Status: DC

## 2014-04-06 MED ORDER — ERYTHROMYCIN 5 MG/GM OP OINT: TOPICAL_OINTMENT | OPHTHALMIC | Status: DC

## 2014-04-06 NOTE — ED Notes (Signed)
Pt A&Ox4, ambulatory at d/c with steady gait, pt declined wheelchair. NAD.

## 2014-04-06 NOTE — Discharge Instructions (Signed)
1. Medications: Keflex, erythromycin, usual home medications 2. Treatment: rest, drink plenty of fluids, warm compresses to the eye 3. Follow Up: Please followup with the ophthalmologist within 2 days; return here to the emergency department for worsening symptoms, high fevers or double vision    Celulitis periorbitaria  (Periorbital Cellulitis)  La celulitis periorbitaria es una infeccin frecuente que puede afectar el prpado y los tejidos blandos que rodean al globo ocular. Esta infeccin tambin puede afectar las estructuras que producen y drenan las lgrimas. No afecta el globo ocular en s mismo. Las barreras de naturales de los tejidos generalmente evitan la diseminacin de esta infeccin al globo ocular y a otras reas profundas de la rbita ocular.  CAUSAS   Infecciones bacterianas.  Infeccin de las cavidades sinusales de larga duracin (crnica).  Un objeto (cuerpo extrao) adherido detrs del ojo.  Una lesin que pasa a travs de los tejidos de los prpados.  Una lesin que causa una infeccin, como la picadura de un insecto.  Fractura del hueso que rodea el ojo.  Infecciones que se han extendido desde el prpado u otras estructuras alrededor del ojo.  Heridas por mordeduras.  Inflamacin o infeccin de las membranas que recubren el cerebro (meningitis).  Infeccin en la sangre (septicemia).  Infeccin dental (absceso).  Infeccin viral (esto es raro). SNTOMAS  Los sntomas generalmente aparecen repentinamente.   Dolor en el ojo.  Los prpados y posiblemente las mejillas estn rojos, calientes e hinchados. La hinchazn es a veces tan importante que los prpados no se pueden abrir. Algunas infecciones hacen que los prpados aparezcan de color prpura.  Grant RutsFiebre y sensacin de Dentistmalestar general.  Dolor al tocar el rea alrededor del ojo. DIAGNSTICO  La celulitis periorbitaria se puede diagnosticar a partir de un examen de la vista. En los casos graves, el  mdico le podr solicitar:   Anlisis de [place].  Estudios de diagnstico por imagen (como una tomografa computada) para examinar las cavidades y el rea que rodea y que se encuentra por detrs del globo ocular. TRATAMIENTO  Si su mdico considera que usted no tiene ningn signo de infeccin grave, el tratamiento puede incluir:   Antibiticos.  Descongestivos nasales para reducir la inflamacin.  Derivacin a un dentista si se sospecha que la infeccin fue causada por una infeccin dental previa.  Controles todos los das para asegurarse de que el problema est mejorando. INSTRUCCIONES PARA EL CUIDADO EN EL HOGAR   Tome los antibiticos como se le indic. Tmelos todos, aunque se sienta mejor.  Es normal sentir un poco de Copywriter, advertisingdolor en esta afeccin. Tome todos los medicamentos segn le indic su mdico. Tome slo la medicacin para el dolor que le indic el mdico.  Es importante beber lquidos. Beba gran cantidad de lquido para mantener la orina de tono claro o color amarillo plido.  No fume.  Haga reposo y [place] Arthur Estatesduerma bien.  La temperatura leve o moderada no causa efectos a Air cabin crewlargo plazo y en general no requiere TEFL teachertratamiento.  Si el mdico le ha dado fecha para una visita de control, es importante que concurra. El mdico necesitar asegurarse de que la infeccin [place]. Es importante comprobar que no se desarrolle una infeccin ms grave. SOLICITE ATENCIN MDICA DE INMEDIATO SI:   Los prpados le duelen ms, estn rojos, calientes o hinchados.  Tiene visin doble o la visin se vuelve borrosa o empeora de algn modo.  Tiene dificultad para mover los ojos.  El ojo parece que est "saliendo hacia afuera" (proptosis).  Siente dolor de cabeza intenso, dolor o rigidez en el cuello.  Tiene vmitos que se repiten.  Tiene fiebre o sntomas que persisten durante ms de 72 horas.  Tiene fiebre y los sntomas empeoran de manera sbita. ASEGRESE DE QUE:   Comprende estas  instrucciones.  Controlar su enfermedad.  Solicitar ayuda de inmediato si no mejora o si empeora. Document Released: 10/01/2012 [facility] Patient Information 2014 [place], [place].

## 2014-04-06 NOTE — ED Provider Notes (Signed)
Medical screening examination/treatment/procedure(s) were performed by non-physician practitioner and as supervising physician I was immediately available for consultation/collaboration.   EKG Interpretation None       Olivia Mackie, MD 04/06/14 [phone]

## 2014-04-08 ENCOUNTER — Other Ambulatory Visit (HOSPITAL_COMMUNITY)
Admission: RE | Admit: 2014-04-08 | Discharge: 2014-04-08 | Disposition: A | Discharge status: Home / Self Care | Payer: No Typology Code available for payment source | Source: Ambulatory Visit | Attending: Family Medicine | Admitting: Family Medicine

## 2014-04-08 ENCOUNTER — Ambulatory Visit (INDEPENDENT_AMBULATORY_CARE_PROVIDER_SITE_OTHER): Payer: No Typology Code available for payment source | Admitting: Family Medicine

## 2014-04-08 ENCOUNTER — Encounter: Payer: Self-pay | Admitting: Family Medicine

## 2014-04-08 VITALS — BP 108/64 | HR 56 | Temp 98.1°F | Ht <= 58 in | Wt 103.0 lb

## 2014-04-08 DIAGNOSIS — Z124 Encounter for screening for malignant neoplasm of cervix: Secondary | ICD-10-CM

## 2014-04-08 DIAGNOSIS — Z01419 Encounter for gynecological examination (general) (routine) without abnormal findings: Secondary | ICD-10-CM | POA: Insufficient documentation

## 2014-04-08 DIAGNOSIS — N898 Other specified noninflammatory disorders of vagina: Secondary | ICD-10-CM

## 2014-04-08 DIAGNOSIS — Z113 Encounter for screening for infections with a predominantly sexual mode of transmission: Secondary | ICD-10-CM | POA: Insufficient documentation

## 2014-04-08 LAB — POCT WET PREP (WET MOUNT): CLUE CELLS WET PREP WHIFF POC: NEGATIVE

## 2014-04-08 NOTE — Patient Instructions (Signed)
Informe de la prueba de Papanicolaou  (Pap Test Information)  El Papanicolaou es un examen mdico que se realiza para evaluar las clulas que estn en la superficie del cuello uterino. El cuello uterino se encuentra entre la parte inferior del tero y la parte superior de la vagina. Esta prueba se realiza en un consultorio en la clnica y Arboriculturistpuede detectar ciertas infecciones, clulas anormales del cuello uterino o cncer cervical. En algunas mujeres, la regin del cuello del tero tiene el potencial de formar clulas cancerosas. Con evaluaciones consistentes, el cncer cervical es raro y puede prevenirse.  FORMAS DIFERENTES DE Kennyth LoseEALIZAR LA PRUEBA DE PAPANICOLAOU  Hay dos maneras para hacer una prueba de Papanicolaou. La diferencia principal Stryker Corporationentre las dos formas tiene que ver con qu instrumentos se utilizan para obtener la Board Campmuestra de clulas y cmo se estudian. En ambas pruebas, se coloca un instrumento tibio de metal o plstico (espculo) en la vagina. El espculo permite al American Expressmdico ver el interior de la vagina y observar el cuello uterino. El mdico va a Chemical engineerutilizar una esptula de Mesquitemadera o un cepillo de plstico para recoger las clulas. Si utiliza una esptula de Yodermadera, las clulas se colocan en un portaobjetos de vidrio y se enva al laboratorio para su anlisis. Si se Bots[place]ausa un cepillo de plstico, se obtienen las clulas y luego el cepillo de plstico y las clulas se colocan en un recipiente con lquido y se envan para ser examinados. Adems, el lquido del recipiente tambin se puede analizar para Architectural technologistdetectar el virus del papiloma humano (VPH) y otras infecciones, como gonorrea y clamidia.  FACTORES DE RIESGO PARA EL CNCER DE CUELLO UTERINO  Los factores de riesgo son los que aumentan la probabilidad de Primary school teachercontraer este tipo de Database administratorcncer. Estos factores son:   Leanor RubensteinHaber tenido un resultado anormal en la prueba de Papanicolaou o sufrir cncer de la vagina o la vulva y evitar el adecuado seguimiento y atencin  mdica.  Ser sexualmente activa antes de los 18 aos.  El hbito de fumar.  Tener un sistema inmunolgico debilitado. El VIH u otros trastornos de inmunodeficiencia que debilitan el sistema inmunolgico.   Despus de sufrir una infeccin de transmisin sexual (ITS), como clamidia, gonorrea o VPH.   Tener una pareja sexual que tiene un historial de contacto directo o indirecto con Engineer, structuralel cncer cervical.   No usar condones con nuevas parejas sexuales.   Ser hija de una mujer que tom dietilestilbestrol (DES) durante el embarazo. COMO PREPARARSE PARA UNA PRUEBA DE PAPANICOLAOU   Programe su prueba de Papanicolaou para un momento en que no es probable que tenga su perodo o consulte si puede estar en su perodo menstrual durante la prueba. Segn el mtodo de Production managerrecoleccin, puede resultar imposible realizar la prueba si usted est en su perodo o este es muy abundante.  Evite las duchas vaginales. Esto puede disminuir el nmero de clulas que se Programmer, applicationspueden evaluar.  Evite tener Equities traderrelaciones sexuales el da anterior o segn las indicaciones de su mdico. Si nunca se hizo un Papanicolaou, consulte a su mdico cmo se hace. El mdico puede tomarse un tiempo para mostrarle y explicarle como usar el espculo y el mtodo de IT sales professionalrecoleccin que usar.  CON QU FRECUENCIA DEBE REALIZARSE EL PAPANICOLAOU?  La frecuencia depende de su edad y su historia clnica. Hable con su mdico acerca de con qu frecuencia debe hacerse la prueba.  RECOMENDACIONES SEGN LA EDAD  Las siguientes recomendaciones son las mismas para las mujeres que han recibido o  no la vacuna para el VPH. Algunas mujeres pueden necesitar exmenes con ms frecuencia si se encuentran en alto riesgo de sufrir cncer cervical.   La primera prueba de Papanicolaou debe realizarse los 21 aos.  Dynegy 21 y 29 aos, una prueba de Papanicolaou cada 2 o 3 aos.  Si tiene 30 aos de edad y sus 3 ltimas pruebas de Papanicolaou han sido normales, debe  hacerla con no ms frecuencia que cada 3 aos. Esto debe ser aprobado por su mdico.  Si tiene 65 aos o ms, pregntele a su mdico si puede dejar de [employer]. RECOMENDACIONES SEGN LA HISTORIA CLNICA  Confirme las recomendaciones que siguen con su mdico. Se recomienda que:   Usted tiene que hacerse su primer Papanicolaou antes de los 21 aos si es sexualmente activa o estn inmunocomprometida (VIH, lupus, o toma medicamentos para trasplante).  Tiene que hacerse pruebas ms frecuentes si su sistema inmunolgico est debilitado debido al VIH, lupus, o si est tomando medicinas para trasplante, y si su madre estuvo expuesta al DES cuando estaba embarazada de usted, o si tiene neoplasia cervical intraepitelial (NIC) 2 o 3.  Usted se ha realizado Papanicolaou y exmenes para Management consultant durante al menos 20 aos despus de un tratamiento para el cncer de cuello uterino o una afeccin que pueda Therapist, music. Hable con su mdico si desarrolla un nuevo problema poco despus de su ltima prueba de Papanicolau. NO NECESITA HACER LA PRUEBA DE PAPANICOLAOU SI:   Tuvo una histerectoma por un problema que no era un cncer o una enfermedad que pueda derivar al cncer. Sin embargo, aunque no necesite el Papanicolaou, hacerse un examen regular es una buena idea para asegurarse de que no hayan comenzado otros problemas graves.  Si tiene entre 65 y 20 aos, y ha tenido pruebas de Papanicolaou normales durante 10 aos, ya no ser Chief Technology Officer.Sin embargo, aunque no necesite el Papanicolaou, hacerse un examen regular es una buena idea para asegurarse de que no hayan comenzado otros problemas graves. Si no se ha Patent attorney con regularidad, Writer a evaluarse los factores de riesgo (como una nueva pareja sexual) para Chief Strategy Officer si debe volver a Printmaker.  RESULTADOS DE LA PRUEBA DE PAPANICOLAOU  La prueba de Papanicolaou normal no muestra clulas anormales o evidencia de  infeccin. Sin embargo, la inflamacin del cuello uterino (cervicitis) es un resultado comn de la prueba de Papanicolaou, pero no tiene importancia.  La presencia de clulas anormales que crecen en la superficie del cuello del tero se pueden registrar como una prueba de Papanicolaou anormal. Si una prueba de Papanicolaou es anormal, es generalmente el resultado de una exposicin previa al VPH. El VPH puede infectar las clulas del cuello uterino y causar displasia. La displasia es cuando las clulas no se ven normales. Si una persona ha sido diagnosticada con displasia de alto grado o displasia grave, est en mayor riesgo de desarrollar cncer cervical. Janne Napoleon diagnosticada con displasia de bajo grado debe ser controlada por su mdico. La displasia de Willis Modena an tiene un pequeo riesgo de Scientist, product/process development. Su mdico determinar qu tipo de Nurse, learning disability o cundo debe realizar su prxima prueba de Papanicolaou.   Si usted ha tenido [place] de Papanicolaou anormal:    Se le puede solicitar una colposcopia. Esta es una prueba en la que se ve el cuello uterino con un microscopio con Patent examiner.   Puede ser necesario tomar Lauris Poag de tejido cervical (  biopsia). Esto consiste en tomar una pequea muestra de tejido del cuello uterino. Document Released: 01/07/2012 [facility]Exit[facility] Patient Information 2014 [place] HarbourExitCare, MarylandLLC.

## 2014-04-08 NOTE — Progress Notes (Signed)
  Subjective:     Susan Freeman is a 23 y.o. female and is here for a comprehensive physical exam. Interpreter present for entire history and physical. The patient reports problems - left eyelid swelling. Went to ED, given antibiotic and eye drops. Getting better. Painful and itchy. No current concerns about it. She also has mild discharge. No chance of pregnancy since she is breast feeding and taking Micronor. No STD exposure.  History   Social History  . Marital Status: Married    Spouse Name: N/A    Number of Children: N/A  . Years of Education: N/A   Occupational History  . Not on file.   Social History Main Topics  . Smoking status: Never Smoker   . Smokeless tobacco: Not on file  . Alcohol Use: No  . Drug Use: No  . Sexual Activity: No   Other Topics Concern  . Not on file   Social History Narrative  . No narrative on file   Health Maintenance  Topic Date Due  . Pap Smear  10/04/2010  . Influenza Vaccine  05/29/2014  . Tetanus/tdap  04/08/2023    The following portions of the patient's history were reviewed and updated as appropriate: allergies, current medications, past family history, past medical history, past social history, past surgical history and problem list.  Review of Systems Pertinent items are noted in HPI.   Objective:    BP 108/64  Pulse 56  Temp(Src) 98.1 F (36.7 C) (Oral)  Ht 4\' 10"  (1.473 m)  Wt 103 lb (46.72 kg)  BMI 21.53 kg/m2  LMP 04/01/2014 General appearance: alert, cooperative and no distress Eyes: erythema and swelling of left eyelid without clear lesion. No sty. No drainage. Lungs: clear to auscultation bilaterally Breasts: Left breast larger than right. No TTP. (currently breastfeeding) Heart: regular rate and rhythm, S1, S2 normal, no murmur, click, rub or gallop Abdomen: soft, non-tender; bowel sounds normal; no masses,  no organomegaly Pelvic: external genitalia normal, no adnexal masses or tenderness, no  cervical motion tenderness, positive findings: large irregular cervix with red, raised lesions at 12:00, rectovaginal septum normal and uterus normal size, shape, and consistency. Yellow-green discharge noted. Extremities: extremities normal, atraumatic, no cyanosis or edema Neurologic: Grossly normal    Assessment:    Healthy female exam.      Plan:   Pap collected. Wet prep, CG/CT also tested. Con't current regimen for eyelid   See After Visit Summary for Counseling Recommendations

## 2014-04-13 ENCOUNTER — Encounter: Payer: Self-pay | Admitting: Family Medicine

## 2014-04-13 LAB — CYTOLOGY - PAP

## 2014-05-26 ENCOUNTER — Ambulatory Visit: Payer: No Typology Code available for payment source

## 2014-08-30 ENCOUNTER — Encounter: Payer: Self-pay | Admitting: Family Medicine

## 2014-10-14 ENCOUNTER — Ambulatory Visit (INDEPENDENT_AMBULATORY_CARE_PROVIDER_SITE_OTHER): Payer: Self-pay | Admitting: Obstetrics and Gynecology

## 2014-10-14 VITALS — BP 120/78 | HR 73 | Temp 98.1°F | Ht <= 58 in | Wt 97.6 lb

## 2014-10-14 DIAGNOSIS — Z3480 Encounter for supervision of other normal pregnancy, unspecified trimester: Secondary | ICD-10-CM | POA: Insufficient documentation

## 2014-10-14 DIAGNOSIS — N926 Irregular menstruation, unspecified: Secondary | ICD-10-CM

## 2014-10-14 DIAGNOSIS — Z3481 Encounter for supervision of other normal pregnancy, first trimester: Secondary | ICD-10-CM

## 2014-10-14 LAB — POCT URINE PREGNANCY: Preg Test, Ur: POSITIVE

## 2014-10-14 MED ORDER — VITAMIN B-6 25 MG PO TABS: 25.0000 mg | ORAL_TABLET | Freq: Every day | ORAL | Status: DC

## 2014-10-14 NOTE — Patient Instructions (Signed)
Primer trimestre de embarazo (First Trimester of Pregnancy) El primer trimestre de embarazo se extiende desde la semana1 hasta el final de la semana12 (mes1 al mes3). Durante este tiempo, el beb comenzar a desarrollarse dentro suyo. Entre la semana6 y la8, se forman los ojos y el rostro, y los latidos del corazn pueden verse en la ecografa. Al final de las 12semanas, todos los rganos del beb estn formados. La atencin prenatal es toda la asistencia mdica que usted recibe antes del nacimiento del beb. Asegrese de recibir una buena atencin prenatal y de seguir todas las indicaciones del mdico. CUIDADOS EN EL HOGAR  Medicamentos:  Tome los medicamentos solamente como se lo haya indicado el mdico. Algunos medicamentos se pueden tomar durante el embarazo y otros no.  Tome las vitaminas prenatales como se lo haya indicado el mdico.  Tome el medicamento que la ayuda a defecar (laxante suave) segn sea necesario, si el mdico lo autoriza. Dieta  Ingiera alimentos saludables de manera regular.  El mdico le indicar la cantidad de peso que puede aumentar.  No coma carne cruda ni quesos sin cocinar.  Si tiene malestar estomacal (nuseas) o vomita:  Ingiera 4 o 5comidas pequeas por da en lugar de 3abundantes.  Intente comer algunas galletitas saladas.  Beba lquidos entre las comidas, en lugar de hacerlo durante estas.  Si tiene dificultad para defecar (estreimiento):  Consuma alimentos con alto contenido de fibra, como verduras y frutas frescos, y cereales integrales.  Beba suficiente lquido para mantener el pis (orina) claro o de color amarillo plido. Actividad y ejercicios  Haga ejercicios solamente como se lo haya indicado el mdico. Deje de hacer ejercicios si tiene clicos o dolor en la parte baja del vientre (abdomen) o en la cintura.  Intente no estar de pie durante mucho tiempo. Mueva las piernas con frecuencia si debe estar de pie en un lugar durante  mucho tiempo.  Evite levantar pesos excesivos.  Use zapatos con tacones bajos. Mantenga una buena postura al sentarse y pararse.  Puede tener relaciones sexuales, a menos que el mdico le indique lo contrario. Alivio del dolor o las molestias  Use un sostn que le brinde buen soporte si le duelen las mamas.  Dese baos con agua tibia (baos de asiento) para aliviar el dolor o las molestias a causa de las hemorroides. Use crema antihemorroidal si el mdico se lo permite.  Descanse con las piernas elevadas si tiene calambres o dolor de cintura.  Use medias de descanso si tiene las venas de las piernas hinchadas y abultadas (venas varicosas). Eleve los pies durante 15minutos, 3 o 4veces por da. Limite la cantidad de sal en su dieta. Cuidados prenatales  Programe las visitas prenatales para la semana12 de embarazo.  Escriba sus preguntas. Llvelas cuando concurra a las visitas prenatales.  Concurra a todas las visitas prenatales como se lo haya indicado el mdico. Seguridad  Colquese el cinturn de seguridad cuando conduzca.  Haga una lista con los nmeros de telfono en caso de emergencia, en la cual deben incluirse los nmeros de los familiares, los amigos, el hospital y los departamentos de polica y de bomberos. Consejos generales  Pdale al mdico que la derive a clases prenatales en su localidad. Debe comenzar a tomar las clases antes de entrar en el mes6 de embarazo.  Pida ayuda si necesita asesoramiento o asistencia con la alimentacin. El mdico puede aconsejarla o indicarle dnde recurrir para recibir ayuda.  No se d baos de inmersin en agua caliente,   baos turcos ni saunas.  No se haga duchas vaginales ni use tampones o toallas higinicas perfumadas.  No mantenga las piernas cruzadas durante mucho tiempo.  Evite el contacto con las bandejas sanitarias de los gatos y la tierra que estos animales usan.  No fume, no consuma hierbas ni beba alcohol. No tome  frmacos que el mdico no haya autorizado.  Visite al dentista. En su casa, lvese los dientes con un cepillo dental suave. Psese el hilo dental con suavidad. SOLICITE AYUDA SI:  Tiene mareos.  Tiene clicos leves o siente presin en la parte baja del vientre.  Siente un dolor persistente en la zona del vientre.  Sigue teniendo malestar estomacal, vomita o las heces son lquidas (diarrea).  Observa una secrecin, con mal olor que proviene de la vagina.  Siente dolor al orinar.  Tiene el rostro, las manos, las piernas o los tobillos ms hinchados (inflamados). SOLICITE AYUDA DE INMEDIATO SI:   Tiene fiebre.  Tiene una prdida de lquido por la vagina.  Tiene sangrado o pequeas prdidas vaginales.  Tiene clicos o dolor muy intensos en el vientre.  Sube o baja de peso rpidamente.  Vomita sangre. Puede ser similar a la borra del caf  Est en contacto con personas que tienen rubola, la quinta enfermedad o varicela.  Siente un dolor de cabeza muy intenso.  Le falta el aire.  Sufre cualquier tipo de traumatismo, por ejemplo, debido a una cada o un accidente automovilstico. Document Released: 01/11/2009 Document Revised: 03/01/2014 ExitCare Patient Information 2015 ExitCare, LLC. This information is not intended to replace advice given to you by your health care provider. Make sure you discuss any questions you have with your health care provider.  

## 2014-10-14 NOTE — Progress Notes (Signed)
     Subjective: Chief Complaint  Patient presents with  . Amenorrhea    lmp 08/01/14    Spanish Interpretor Used   HPI: Susan Freeman is a 23 y.o. presenting to clinic today to discuss whether or not she is pregnant. Patient states that her LMP was 08/01/2014. She took a home pregnancy test which came back positive. She wanted to come into the office today to confirm pregnancy and get a letter for [employer]. Patient does say that this is a planned pregnancy and she is looking forward to being pregnant again. She says that she is experiencing daily vomiting(x10/d). She has not taking anything for this but would like a recommendation on something to help.  PMH - none Meds - Prenatal vitamin  OB Hx - G2P1, LMP 08/01/2014 [id number]. Last pregnancy was vaginal delivery no complications.   All systems were reviewed and were negative unless otherwise noted in the HPI Past Medical, Surgical, Social, and Family History Reviewed & Updated per EMR.   Objective: BP 120/78 mmHg  Pulse 73  Temp(Src) 98.1 F (36.7 C) (Oral)  Ht 4\' 10"  (1.473 m)  Wt 97 lb 9.6 oz (44.271 kg)  BMI 20.40 kg/m2  LMP 08/01/2014 (Exact Date)  Breastfeeding? No  Physical Exam  Constitutional: She is oriented to person, place, and time and well-developed, well-nourished, and in no distress.  Eyes: EOM are normal. Pupils are equal, round, and reactive to light.  Neck: Normal range of motion. Neck supple.  Cardiovascular: Normal rate, regular rhythm and normal heart sounds.   Pulmonary/Chest: Effort normal.  Abdominal: Soft. Bowel sounds are normal. She exhibits no distension. There is no tenderness.  Gravid uterus palpated about ~2cm above pubic symphysis.   Musculoskeletal: Normal range of motion.  Neurological: She is alert and oriented to person, place, and time. No cranial nerve deficit.  Skin: Skin is warm and dry.  Psychiatric: Mood and affect normal.     Assessment/Plan: Please see problem  based Assessment and Plan    Caryl AdaJazma Phelps, DO 10/14/2014, 2:27 PM PGY-1, [facility]

## 2014-10-16 ENCOUNTER — Encounter: Payer: Self-pay | Admitting: Obstetrics and Gynecology

## 2014-10-16 NOTE — Assessment & Plan Note (Addendum)
A: Pregnancy test positive. EDD: 05-07-2014. PHQ-9 score 10.  P: Patient received letter for [employer]. She will get assignment to our clinic and then we will do all her initial obstetrical labs. She was given Rx for Vitamin B-6 to help with the vomiting. Handout given for 1st trimester pregnancy.

## 2014-10-25 ENCOUNTER — Ambulatory Visit: Payer: Self-pay

## 2014-10-29 NOTE — L&D Delivery Note (Cosign Needed)
Delivery Note At 11:10 PM a viable female was delivered via Vaginal, Spontaneous Delivery (Presentation: Left Occiput Anterior).  APGAR: 9, 9; weight pending.   Placenta status: Intact, Spontaneous.  Cord: 3 vessels with the following complications: None.  Cord pH: n/a  Anesthesia: Epidural  Episiotomy: None Lacerations: None Suture Repair: None Est. Blood Loss (mL): 250cc    Mom to postpartum.  Baby to Couplet care / Skin to Skin.  Upon arrival patient was complete and ready to push. She pushed for several contractions with good maternal effort to deliver a healthy baby boy. Delivery without difficulty, baby with good tone and placed on maternal abdomen for oral suctioning, drying and stimulation. Delayed cord clamping performed and cut by Mother. Placenta delivered intact with 3V cord. Vaginal canal and perineum was inspected and intact. Pitocin was started and uterus massaged until bleeding slowed. Counts of sharps, instruments, and lap pads were all correct.    Saralyn PilarAlexander Karamalegos, DO [facility], PGY-3 05/04/2015, 11:47 PM   Patient is a G2P1001 at 10135w4d who was admitted w/ SOL, uncomplicated prenatal course.  She progressed without augmentation.  I was gloved and present for delivery in its entirety.  Second stage of labor progressed to SVD  Complications: none  Lacerations: none  EBL: 250  Kalyna Paolella, CNM 2:19 AM

## 2014-11-08 ENCOUNTER — Other Ambulatory Visit: Payer: Self-pay

## 2014-11-08 DIAGNOSIS — Z3481 Encounter for supervision of other normal pregnancy, first trimester: Secondary | ICD-10-CM

## 2014-11-08 NOTE — Progress Notes (Signed)
Roney Jafferew OB labs;  Sickle cell = negative on 12-04-12 (not repeated);

## 2014-11-08 NOTE — Progress Notes (Signed)
New ob labs done today Susan Freeman 

## 2014-11-09 LAB — OBSTETRIC PANEL
ANTIBODY SCREEN: NEGATIVE
BASOS PCT: 0 % (ref 0–1)
Basophils Absolute: 0 10*3/uL (ref 0.0–0.1)
EOS ABS: 0.1 10*3/uL (ref 0.0–0.7)
EOS PCT: 2 % (ref 0–5)
HEMATOCRIT: 36.7 % (ref 36.0–46.0)
Hemoglobin: 12.5 g/dL (ref 12.0–15.0)
Hepatitis B Surface Ag: NEGATIVE
Lymphocytes Relative: 36 % (ref 12–46)
Lymphs Abs: 2.2 10*3/uL (ref 0.7–4.0)
MCH: 29.4 pg (ref 26.0–34.0)
MCHC: 34.1 g/dL (ref 30.0–36.0)
MCV: 86.4 fL (ref 78.0–100.0)
MONO ABS: 0.2 10*3/uL (ref 0.1–1.0)
MONOS PCT: 4 % (ref 3–12)
MPV: 9.5 fL (ref 8.6–12.4)
Neutro Abs: 3.5 10*3/uL (ref 1.7–7.7)
Neutrophils Relative %: 58 % (ref 43–77)
Platelets: 264 10*3/uL (ref 150–400)
RBC: 4.25 MIL/uL (ref 3.87–5.11)
RDW: 14.8 % (ref 11.5–15.5)
RH TYPE: POSITIVE
Rubella: 1.36 Index — ABNORMAL HIGH (ref <0.90)
WBC: 6 10*3/uL (ref 4.0–10.5)

## 2014-11-09 LAB — HIV ANTIBODY (ROUTINE TESTING W REFLEX): HIV: NONREACTIVE

## 2014-11-25 ENCOUNTER — Encounter: Payer: Self-pay | Admitting: Family Medicine

## 2014-11-25 ENCOUNTER — Ambulatory Visit (INDEPENDENT_AMBULATORY_CARE_PROVIDER_SITE_OTHER): Payer: Self-pay | Admitting: Family Medicine

## 2014-11-25 ENCOUNTER — Other Ambulatory Visit (HOSPITAL_COMMUNITY)
Admission: RE | Admit: 2014-11-25 | Discharge: 2014-11-25 | Disposition: A | Discharge status: Home / Self Care | Payer: Self-pay | Source: Ambulatory Visit | Attending: Family Medicine | Admitting: Family Medicine

## 2014-11-25 VITALS — BP 112/69 | HR 67 | Temp 98.3°F | Wt 102.0 lb

## 2014-11-25 DIAGNOSIS — Z113 Encounter for screening for infections with a predominantly sexual mode of transmission: Secondary | ICD-10-CM | POA: Insufficient documentation

## 2014-11-25 DIAGNOSIS — Z3482 Encounter for supervision of other normal pregnancy, second trimester: Secondary | ICD-10-CM

## 2014-11-25 NOTE — Patient Instructions (Signed)
Dear Susan Freeman, Thank you for coming in to clinic today. It was good to meet you!  Please schedule a follow-up appointment with [facility] in 2 weeks for next appointment - discuss Ultrasound results.  If you have any other questions or concerns, please feel free to call the clinic to contact me. You may also schedule an earlier appointment if necessary.  However, if your symptoms get significantly worse, please go to the Emergency Department to seek immediate medical attention.  Susan Pilar, DO [facility]  --------  Dear Susan Freeman, Thank you for coming in to clinic today. It was good to meet you!  Please schedule a follow-up appointment with [facility] in 2 weeks for next appointment - discuss Ultrasound results.  If you have any other questions or concerns, please feel free to call the clinic to contact me. You may also schedule an earlier appointment if necessary.  However, if your symptoms get significantly worse, please go to the Emergency Department to seek immediate medical attention.  Susan Pilar, DO [facility]    Herriman trimestre de [place] (Second Trimester of Pregnancy) El segundo trimestre va desde la semana13 hasta la 28, desde el cuarto hasta el sexto mes, y suele ser el momento en el que mejor se siente. Su organismo se ha adaptado a [name] y comienza a Diplomatic Services operational officer. En general, las nuseas matutinas han disminuido o han desaparecido completamente, p El segundo trimestre es tambin la poca en la que el feto se desarrolla rpidamente. Hacia el final del sexto mes, el feto mide aproximadamente 9pulgadas (23cm) y pesa alrededor de 1 libras (700g). Es probable que sienta que el beb se Teacher, English as a foreign language (da pataditas) entre las 18 y 20semanas del Psychiatrist. CAMBIOS EN EL ORGANISMO Su organismo atraviesa por muchos cambios durante el [place], y estos varan de  Neomia Dear mujer a Educational psychologist.   Seguir [employer]. Notar que la parte baja del abdomen sobresale.  Podrn aparecer las primeras Albertson's caderas, el abdomen y las [place].  Es posible que tenga dolores de cabeza que pueden aliviarse con los medicamentos que su mdico autorice.  Tal vez tenga necesidad de orinar con ms frecuencia porque el feto est ejerciendo presin Ambulance person.  Debido al Vanetta Mulders podr sentir Anthoney Harada estomacal con frecuencia.  Puede estar estreida, ya que ciertas hormonas enlentecen los movimientos de los msculos que [employer] desechos a travs de los intestinos.  Pueden aparecer hemorroides o abultarse e hincharse las venas (venas varicosas).  Puede tener dolor de espalda que se debe al Citigroup de peso y a que las hormonas del Management consultant las articulaciones entre los huesos de la pelvis, y Public librarian consecuencia de la modificacin del peso y los msculos que mantienen el equilibrio.  Las Con[employer]ra Foods seguirn creciendo y Development worker, community.  Las Veterinary surgeon y estar sensibles al cepillado y al hilo dental.  Pueden aparecer zonas oscuras o manchas (cloasma, mscara del Psychiatrist) en el rostro que probablemente se atenuarn despus del nacimiento del beb.  Es posible que se forme una lnea oscura desde el ombligo hasta la zona del pubis (linea nigra) que probablemente se atenuarn despus del nacimiento del beb.  Tal vez haya cambios en el cabello que pueden incluir su engrosamiento, crecimiento rpido y cambios en la textura. Adems, a algunas mujeres se les cae el cabello durante o despus del embarazo, o tienen el cabello seco o fino. Lo ms probable es  que el cabello se le normalice despus del nacimiento del beb. QU DEBE ESPERAR EN LAS CONSULTAS PRENATALES Durante una visita prenatal de rutina:  La pesarn para asegurarse de que usted y el feto estn creciendo normalmente.  Le tomarn la presin arterial.  Le medirn el abdomen para controlar el  desarrollo del beb.  Se escucharn los latidos cardacos fetales.  Se evaluarn los resultados de los estudios solicitados en visitas anteriores. El mdico puede preguntarle lo siguiente:  Cmo se siente.  Si siente los movimientos del beb.  Si ha tenido sntomas anormales, como prdida de lquido, [place], dolores de cabeza intensos o clicos abdominales.  Si tiene [employer]. Otros estudios que podrn realizarse durante el segundo trimestre incluyen lo siguiente:  Anlisis de sangre para detectar:  Concentraciones de hierro bajas (anemia).  Diabetes gestacional (entre la semana 24 y la 28).  Anticuerpos Rh.  Anlisis de orina para detectar infecciones, diabetes o protenas en la orina.  Una ecografa para confirmar que el beb crece y se desarrolla correctamente.  Una amniocentesis para diagnosticar posibles problemas genticos.  Estudios del feto para descartar espina bfida y sndrome de [place]. INSTRUCCIONES PARA EL CUIDADO EN EL HOGAR   Evite fumar, consumir hierbas, beber alcohol y tomar frmacos que no le hayan recetado. Estas sustancias qumicas afectan la formacin y el desarrollo del beb.  Siga las indicaciones del mdico en relacin con el uso de medicamentos. Durante el embarazo, hay medicamentos que son seguros de tomar y otros que no.  Haga actividad fsica solo en la forma indicada por el mdico. Sentir clicos uterinos es un buen signo para Restaurant manager, fast food actividad fsica.  Contine comiendo alimentos que sanos con regularidad.  Use un sostn que le brinde buen soporte si le [employer]ria [employer].  No se d baos de inmersin en agua caliente, baos turcos ni saunas.  Colquese el cinturn de seguridad cuando conduzca.  No coma carne cruda ni queso sin cocinar; evite el contacto con las bandejas sanitarias de los gatos y la tierra que estos animales usan. Estos elementos contienen grmenes que pueden causar defectos congnitos en el beb.  Tome las  vitaminas prenatales.  Si est estreida, pruebe un laxante suave (si el mdico lo autoriza). Consuma ms alimentos ricos en fibra, como vegetales y frutas frescos y Radiation protection practitioner. Beba gran cantidad de lquido para mantener la orina de tono claro o color amarillo plido.  Dese baos de asiento con agua tibia para Engineer, materials o las molestias causadas por las hemorroides. Use una crema para las hemorroides si el mdico la autoriza.  Si tiene venas varicosas, use medias de descanso. Eleve los pies durante , 3 o 4veces por da. Limite la cantidad de sal en su dieta.  No levante objetos pesados, use zapatos de tacones bajos y [address].  Descanse con las piernas elevadas si tiene calambres o dolor de cintura.  Visite a su dentista si an no lo ha Occupational hygienist. Use un cepillo de dientes blando para higienizarse los dientes y psese el hilo dental con suavidad.  Puede seguir [employer], a menos que el mdico le indique lo contrario.  Concurra a todas las visitas prenatales segn las indicaciones de su mdico. SOLICITE ATENCIN MDICA SI:   [place].  Siente clicos leves, presin en la pelvis o dolor persistente en el abdomen.  Tiene nuseas, vmitos o diarrea persistentes.  Tiene secrecin vaginal con mal olor.  Siente dolor al [employer]ocoPhillips. SOLICITE  ATENCIN MDICA DE INMEDIATO SI:   Tiene fiebre.  Tiene una prdida de lquido por la vagina.  Tiene sangrado o pequeas prdidas vaginales.  Siente dolor intenso o clicos en el abdomen.  Sube o baja de peso rpidamente.  Tiene dificultad para respirar y siente dolor de pecho.  Sbitamente se le hinchan mucho el rostro, las [place]ainesmanos, los tobillos, los pies o las piernas.  No ha sentido los movimientos del beb durante Georgianne Fickuna hora.  Siente un dolor de cabeza intenso que no se alivia con medicamentos.  Hay cambios en la visin. Document Released: 07/25/2005  Document Revised: 10/20/2013 [facility]ExitCare Patient Information 2015 [place]. This information is not intended to replace advice given to you by your health care provider. Make sure you discuss any questions you have with your health care provider.

## 2014-11-25 NOTE — Progress Notes (Signed)
History provided by patient with assistance via Spanish Language Interpreter present during entire visit.  Susan Freeman is a 24 y.o. yo G2P1001 who presents for her initial prenatal visit. Today reports LMP as 07/07/14, however previously reported 08/01/14. Patient remains unsure at this time. Her last pregnancy was uncomplicated with NSVD, briefly 2853w3d 7 lb baby boy on 06/11/13 Denies any significant prior OB history. No elevated BP or blood sugar during previous pregnancy. Pregnancy is not planned. Presents to visit today with partner / father of baby, Berton MountMiguel Nado. She reports occasional intermittent abdominal and lower back pains. Occasional morning headaches, improved nausea. Taking PNV. See flow sheet for details.  PMH, POBH, FH, meds, allergies and Social Hx reviewed.  Prenatal exam: Gen: Well nourished, well developed. NAD HEENT: NCAT.  Neck supple without cervical lymphadenopathy, thyromegaly CV: RRR, no murmur, gallops or rubs Lungs: CTA B.  Normal respiratory effort without wheezes or rales. Abd: soft, appropriately gravid uterus below umbilicus, NTND. +BS. Ext: Non-tender, no edema, bilateral peripheral pulses intact +2 b/l MSK: Back non-tender over spinous processes or paraspinal muscles. Seated Slump SLR negative. Neuro: awake, alert, grossly non-focal, intact muscle strength 5/5 grip, knee flex, ankle dorsiflex, gait normal Psych: Normal grooming and dress.  Not depressed or anxious appearing.  Normal thought content   Assessment/plan: - Susan HeaterMarisela Patchen is a 24 y.o. G2P1001 at 3067w1d by estimated LMP, however suspect given fundal height approx 17cm and decreased wt gain, suspect LMP in October 2015 (as previously stated) seems more likely - Well-appearing, some stable intermittent pain complaints (abdomen, lower back with occasional sciatica), improved with Tylenol and rest, likely due to 2nd pregnancy. Normal exam today, no persistent symptoms, no neuro red  flags - Prenatal labs reviewed - overall unremarkable. Last Pap Smear 03/2014 (negative) - Collected Urine cytology (GC/Chlamydia, last cervical GC/Chlam done 03/2014), Urine Culture - Scheduled Anatomy OB US +14 wk - 12/02/14 (anticipate using US for future dating) - Bleeding and pain precautions reviewed. - Continue PNV. Importance of prenatal vitamins reviewed. - Genetic screening discussed, but ultimately not offered given current LMP dating >20wk. - Early glucola is not indicated.    [facility] - Monitor for improved wt gain, discussed eating 3 full regular meals + snacks (despite some decreased appetite) - Due for Varicella Titer (not included on initial OB panel) - Due Influenza vaccine - Review Anatomy [place]

## 2014-11-25 NOTE — Assessment & Plan Note (Signed)
See vitals & notes for details on supervision of pregnancy

## 2014-11-26 LAB — URINE CULTURE
Colony Count: NO GROWTH
ORGANISM ID, BACTERIA: NO GROWTH

## 2014-11-26 LAB — URINE CYTOLOGY ANCILLARY ONLY
Chlamydia: NEGATIVE
NEISSERIA GONORRHEA: NEGATIVE

## 2014-12-02 ENCOUNTER — Ambulatory Visit (HOSPITAL_COMMUNITY)
Admission: RE | Admit: 2014-12-02 | Discharge: 2014-12-02 | Disposition: A | Discharge status: Home / Self Care | Payer: Self-pay | Source: Ambulatory Visit | Attending: Family Medicine | Admitting: Family Medicine

## 2014-12-02 ENCOUNTER — Other Ambulatory Visit: Payer: Self-pay | Admitting: Family Medicine

## 2014-12-02 DIAGNOSIS — Z36 Encounter for antenatal screening of mother: Secondary | ICD-10-CM | POA: Insufficient documentation

## 2014-12-02 DIAGNOSIS — Z3482 Encounter for supervision of other normal pregnancy, second trimester: Secondary | ICD-10-CM

## 2014-12-02 DIAGNOSIS — Z3A18 18 weeks gestation of pregnancy: Secondary | ICD-10-CM | POA: Insufficient documentation

## 2014-12-02 DIAGNOSIS — Z3A21 21 weeks gestation of pregnancy: Secondary | ICD-10-CM | POA: Insufficient documentation

## 2014-12-02 DIAGNOSIS — Z3689 Encounter for other specified antenatal screening: Secondary | ICD-10-CM | POA: Insufficient documentation

## 2014-12-16 ENCOUNTER — Ambulatory Visit (INDEPENDENT_AMBULATORY_CARE_PROVIDER_SITE_OTHER): Payer: No Typology Code available for payment source | Admitting: Family Medicine

## 2014-12-16 ENCOUNTER — Encounter: Payer: Self-pay | Admitting: Family Medicine

## 2014-12-16 VITALS — BP 110/64 | HR 71 | Temp 98.0°F | Wt 106.0 lb

## 2014-12-16 DIAGNOSIS — Z23 Encounter for immunization: Secondary | ICD-10-CM

## 2014-12-16 DIAGNOSIS — Z3689 Encounter for other specified antenatal screening: Secondary | ICD-10-CM

## 2014-12-16 DIAGNOSIS — Z36 Encounter for antenatal screening of mother: Secondary | ICD-10-CM

## 2014-12-16 DIAGNOSIS — Z3482 Encounter for supervision of other normal pregnancy, second trimester: Secondary | ICD-10-CM

## 2014-12-16 NOTE — Patient Instructions (Signed)
Fue un Research officer, trade unionplacer verle hoy.  Hoy tiene 20 semanas con 4 dias de Psychiatristembarazo, segun el ultrasonido mas reciente. Esto corresponde a una fecha aproximada de parto del 03 de [place].  Como hablamos hoy, estamos marcando otro ultrasonido en 2 semanas para completar la visualizacion de la anatomia del bebe (que no se pudo ver completo por la posicion en el vientre).   Todo lo que se pudo ver en el estudio estaba completamente normal.  Siga tomando las vitaminas antenatales como esta' haciendo.   Le estamos vacunando contra la gripa hoy, que es una recomendacion para protejerle a Ud y a Therapist, occupationalsu bebe.  Quiero que vuelva en 4 semanas para su proxima visita con el Dr Althea CharonKaramalegos.  ROUTINE PRENATAL VISIT IN 4 WEEKS AND IN 8 WEEKS WITH DR KARAMALEGOS.

## 2014-12-16 NOTE — Progress Notes (Signed)
[facility] Patient visit conducted in Spanish.  Patient for routine prenatal visit.  Reviewed results of her recent US done on Dec 02, 2014, which gives an EDD 05/01/2015, making her EGA today 6757w4d by US.  Discussed incomplete anatomy visualization and recommendation for repeat US 4 weeks afterward to confirm anatomy. Patient is in agreement with this plan. Reports resolution of first trimester nausea/vomiting. States that her pregravid weight was @116  lbs, had significant first-trimester weight loss. Is now regaining weight, describes her appetite as adequate. Taking PNVs daily. No other meds, no medication allergies.  Reports daily fetal movement, started several weeks ago. No LOF, no vaginal bleeding/discharge. No smokers in home. Plans to breast feed.   Discussed recommendation for influenza vaccine today, patient consents to this.  Also anticipated Tdap after 27 weeks. Patient without first degree relatives with DM (does have grandparents with DM). No indication for early glucola screening, will plan for this at 24-28 week visit.  PLAN today for scheduling repeat US to complete anatomy visualization, in another 2 weeks (4 weeks from initial anatomy scan). Continue PNVs.  Follow up with Dr Althea CharonKaramalegos in 4 weeks (24 week visit) and 8 weeks (28-week visit, will need Pregnancy Medical Home Risk Tool, PHQ9, Tdap and 28-week labs including 1hGTT at that time).  Paula ComptonJames Shandria Clinch, MD

## 2014-12-16 NOTE — Assessment & Plan Note (Signed)
[facility] See Flow Sheet.  Ordered follow up detailed OB US for complete visualization of anatomy (see report from Dec 02, 2014 US). JB

## 2014-12-30 ENCOUNTER — Ambulatory Visit (HOSPITAL_COMMUNITY)
Admission: RE | Admit: 2014-12-30 | Discharge: 2014-12-30 | Disposition: A | Discharge status: Home / Self Care | Payer: Self-pay | Source: Ambulatory Visit | Attending: Family Medicine | Admitting: Family Medicine

## 2014-12-30 ENCOUNTER — Telehealth: Payer: Self-pay | Admitting: Family Medicine

## 2014-12-30 DIAGNOSIS — Z3482 Encounter for supervision of other normal pregnancy, second trimester: Secondary | ICD-10-CM | POA: Insufficient documentation

## 2014-12-30 DIAGNOSIS — IMO0002 Reserved for concepts with insufficient information to code with codable children: Secondary | ICD-10-CM | POA: Insufficient documentation

## 2014-12-30 DIAGNOSIS — Z0489 Encounter for examination and observation for other specified reasons: Secondary | ICD-10-CM | POA: Insufficient documentation

## 2014-12-30 DIAGNOSIS — Z36 Encounter for antenatal screening of mother: Secondary | ICD-10-CM | POA: Insufficient documentation

## 2014-12-30 DIAGNOSIS — Z3689 Encounter for other specified antenatal screening: Secondary | ICD-10-CM

## 2014-12-30 DIAGNOSIS — Z3A22 22 weeks gestation of pregnancy: Secondary | ICD-10-CM | POA: Insufficient documentation

## 2014-12-30 NOTE — Telephone Encounter (Signed)
Call to patient in Spanish.  I informed her of the results of the repeat US, unremarkable repeat study.  JB

## 2015-01-14 ENCOUNTER — Ambulatory Visit (INDEPENDENT_AMBULATORY_CARE_PROVIDER_SITE_OTHER): Payer: No Typology Code available for payment source | Admitting: Family Medicine

## 2015-01-14 ENCOUNTER — Encounter: Payer: Self-pay | Admitting: Family Medicine

## 2015-01-14 VITALS — BP 106/67 | HR 74 | Temp 98.3°F | Wt 110.0 lb

## 2015-01-14 DIAGNOSIS — Z3482 Encounter for supervision of other normal pregnancy, second trimester: Secondary | ICD-10-CM

## 2015-01-14 NOTE — Progress Notes (Signed)
Interview conducted in Spanish with assistance of Spanish Language Interpreter Ashby Dawes(Graciela)  S: [name] Sheffer is a 24 y.o. G2P1001 at 940w5d by LMP / US.    Overall doing well. Taking PNV daily. Reports some occasional low back pains without radiation to legs, improved by rest and Tylenol (takes only when needed). Weight gain 4 lbs 1 in month since last visit, admits to continued poor appetite, but trying to eat small amounts throughout the day. Tolerating fluids well, drinking plenty of water and staying hydrated. - Admits occasional mild edema in feet if standing too long - Denies any nausea, vomiting, headaches, vision changes, worsening edema, dysuria  Fetal Movement - daily Pain / Contractions - irregular occasional abd cramping. No regular contractions               Denies any vaginal bleeding, discharge, or fluid leakage  O: BP 106/67 mmHg  Pulse 74  Temp(Src) 98.3 F (36.8 C)  Wt 110 lb (49.896 kg)  LMP 07/07/2014 (Approximate)  Gen - well-appearing, NAD HEENT - MMM Abd - soft, appropriately gravid for GA, NTND, +active BS Ext - non-tender, no edema, peripheral pulses intact +2 b/l  A&P: Normal supervision of pregnancy, in 2nd trimester, at 4240w5d - cont daily PNV, Tylenol PRN - Improved wt gain, cont inc smaller frequent meals / snacks - fetal movement and labor precautions reviewed  Return in 3-4 weeks for next Routine OB Visit (with me) - at [redacted] wks GA At 28 wk visit will need: - CBC, HIV, RPR, Varicella-Zoster Titer (future orders placed) - 1 hr GTT (future order placed) - TDap immunization - PMH form, PHQ-9

## 2015-01-14 NOTE — Patient Instructions (Addendum)
Thank you for coming into clinic today. It sounds like everything is going well. You are currently 24 weeks and 5 days into your pregnancy. Your baby looks well on the last Ultrasound. There are no concerns. Continue Prenatal Vitamin daily You may take Tylenol for back pain as needed. Continue to work on eating more regularly, small meals and snacks. Drink plenty of water to avoid dehydration which can cause early contractions.  Please schedule next appointment with me (Dr. Althea CharonKaramalegos) for ROUTINE PRENATAL in 3 to 4 weeks - at 28 weeks of pregnancy.  You will get blood work (including 1 hour sugar test), TDap immunization, and have a few forms to complete.  If you have any significant concerns or leakage of fluid, regular contractions, please [facility].  --  Gracias por entrar a la clnica hoy . Suena como que todo va bien . Ahora mismo ests 24 semanas y 5 [address]  [address] en su embarazo. El beb se ve bien en el ltimo ultrasonido . No hay preocupaciones . Continuar prenatal vitamina diaria Puede tomar Tylenol para el dolor de espalda , segn sea necesario . Continuar trabajando en comer con ms regularidad , pequeas comidas y bocadillos . Beba mucha agua para evitar la deshidratacin que puede causar contracciones tempranas .  Por favor, programar la prxima cita conmigo ( Dr. Althea CharonKaramalegos ) para prenatal de rutina en 3 a 4 semanas - a las 28 semanas de Edisto Beachembarazo.  Obtendr anlisis de sangre ( incluyendo la prueba de International aid/development workerazcar en 1 hora) , vacuna Tdap , y tienen unos formularios para Warden/rangercompletar.   Si usted tiene alguna preocupacin significativas o fugas de fluidos, contracciones regulares , por favor vaya directamente a la Unidad de Admisin del hospital de maternidad de las mujeres .

## 2015-01-27 ENCOUNTER — Encounter: Payer: Self-pay | Admitting: Family Medicine

## 2015-01-27 ENCOUNTER — Ambulatory Visit (INDEPENDENT_AMBULATORY_CARE_PROVIDER_SITE_OTHER): Payer: No Typology Code available for payment source | Admitting: Family Medicine

## 2015-01-27 VITALS — BP 106/66 | HR 70 | Temp 98.3°F | Ht <= 58 in | Wt 112.8 lb

## 2015-01-27 DIAGNOSIS — L509 Urticaria, unspecified: Secondary | ICD-10-CM

## 2015-01-27 MED ORDER — TRIAMCINOLONE ACETONIDE 0.1 % EX CREA: 1.0000 "application " | TOPICAL_CREAM | Freq: Two times a day (BID) | CUTANEOUS | Status: DC | PRN

## 2015-01-27 NOTE — Progress Notes (Signed)
   Subjective:    Patient ID: Susan Freeman, female    DOB: 11/16/1990, 24 y.o.   MRN: [id number]  Visit conducted in Spanish with in-person interpretation provided by [facility](Language Services).  HPI: Pt presents to clinic for skin irritation / hives, present for about 4 days. She has irritation and itchiness on her fingers and toes and hands / soles. She has no other rashes. She had similar hives early in her last pregnancy about 2 years ago, but not with as much itching. She has used hydrocortisone cream over-the-counter, which has not helped; during her previous pregnancy, the cream did not help, either. She had a prescription cream that helped, previously but she can't remember the name (on chart review, no obvious Rx was given). She has no fever, chills, sick contacts, other family members with rashes, N/V, abdominal pain. She does not know of any exposures to chemicals or other new substances / detergents / etc.  Of note, pt is 8224w4d pregnant and generally feels well, otherwise. She endorses good fetal movements. She denies contractions, bleeding, vaginal discharge, or LOF. She is due for her 28w check in about 2 weeks.  Review of Systems: As above.     Objective:   Physical Exam BP 106/66 mmHg  Pulse 70  Temp(Src) 98.3 F (36.8 C) (Oral)  Ht 4\' 10"  (1.473 m)  Wt 112 lb 12.8 oz (51.166 kg)  BMI 23.58 kg/m2  LMP 07/07/2014 (Approximate) Gen: well-appearing adult female in NAD, very pleasant HEENT: [place], AT, EOMI, PERRLA, MMM Cardio: RRR, no murmur appreciated Pulm: normal WOB Abd: soft, nontender, BS+; gravid Skin: few urticaria-like lesions on dorsal fingers, R worse than left, very pruritic with increased itching with touch for exam  Similar lesions to right and left ankles and right lower leg just above ankle, medially  No skin-fold tunneling, petechial lesions, or other rashes noted  No broken skin, active bleeding / drainage, or induration / warmth     Assessment  & Plan:  24yo female with urticaria in pregnancy; possible cholestasis, PUPPP, or similar issue vs exposure to unknown substance - overall appears self-limited and does not have typical appearance to suggest scabies or other infestation - recommended sparing use of triamcinolone cream, with Rx given today - also suggested Benadryl PRN (counseled on side effects) for itching - defer any labs or other testing, for now - f/u with Dr. Althea CharonKaramalegos as scheduled in 2 weeks for 28w OB visit, or sooner if needed if any new symptoms arise or if current symptoms worsen  Note FYI to Dr. Halina AndreasK.  Kwane Rohl M Tishana Clinkenbeard, MD PGY-3, [facility] 01/27/2015, 12:25 PM

## 2015-01-27 NOTE — Patient Instructions (Signed)
Thank you for coming in, today!  I think you have hives that could be related to your pregnancy. You can try a prescription cream (triamcinolone) up to twice per day as needed. You can also take Benadryl (diphenhydramine), 25 or 50 mg, up to every 6-8 hours as needed. Benadryl can help with itching but might make you very sleepy.  Come back to see Dr. Kirtland BouchardK for your next appointment. He may want to try different medicine or adjust things, depending on how these medicines work. If your symptoms get worse instead of better, if the medicines don't work, or if new problems come up, come back sooner than that. Please feel free to call with any questions or concerns at any time, at [phone]. --Dr. Casper HarrisonStreet

## 2015-02-11 ENCOUNTER — Ambulatory Visit (INDEPENDENT_AMBULATORY_CARE_PROVIDER_SITE_OTHER): Payer: Self-pay | Admitting: Family Medicine

## 2015-02-11 VITALS — BP 101/64 | HR 63 | Temp 98.6°F | Wt 114.0 lb

## 2015-02-11 DIAGNOSIS — M545 Low back pain, unspecified: Secondary | ICD-10-CM

## 2015-02-11 DIAGNOSIS — Z23 Encounter for immunization: Secondary | ICD-10-CM

## 2015-02-11 DIAGNOSIS — M549 Dorsalgia, unspecified: Secondary | ICD-10-CM | POA: Insufficient documentation

## 2015-02-11 DIAGNOSIS — Z3482 Encounter for supervision of other normal pregnancy, second trimester: Secondary | ICD-10-CM

## 2015-02-11 LAB — CBC
HEMATOCRIT: 31.8 % — AB (ref 36.0–46.0)
Hemoglobin: 10.9 g/dL — ABNORMAL LOW (ref 12.0–15.0)
MCH: 29 pg (ref 26.0–34.0)
MCHC: 34.3 g/dL (ref 30.0–36.0)
MCV: 84.6 fL (ref 78.0–100.0)
MPV: 9.8 fL (ref 8.6–12.4)
PLATELETS: 279 10*3/uL (ref 150–400)
RBC: 3.76 MIL/uL — ABNORMAL LOW (ref 3.87–5.11)
RDW: 13.5 % (ref 11.5–15.5)
WBC: 7.8 10*3/uL (ref 4.0–10.5)

## 2015-02-11 LAB — GLUCOSE, CAPILLARY
Comment 1: 1
Glucose-Capillary: 111 mg/dL — ABNORMAL HIGH (ref 70–99)

## 2015-02-11 NOTE — Assessment & Plan Note (Signed)
See detailed note in Flow Sheet.

## 2015-02-11 NOTE — Patient Instructions (Signed)
Thank you for coming into clinic today. Your pregnancy is going well. You are currently 28 weeks and 5 days into your pregnancy. Your baby looks well on the last Ultrasound. There are no concerns. Continue Prenatal Vitamin daily  For Back Pain - i think that this is a muscle spasm. Take Tylenol 500mg  (2 tablets) every 6 to 8 hours - (Total 3 times a day - maximum 6 tablets in 24 hours). Keep using heated towel on your back. Stay hydrated. For Foot - you can use a frozen water bottle and stretch out the bottom of your foot. Tylenol will help this too  Continue to work on eating more regularly, small meals and snacks. High protein foods. Drink plenty of water to avoid dehydration which can cause early contractions.  Please schedule next appointment with me (Dr. Althea CharonKaramalegos) for ROUTINE PRENATAL in 3 to 4 weeks - around 32 weeks pregnancy.  If you have any significant concerns or leakage of fluid, regular contractions, please go directly [facility].  ---------  Karl PockGracias por entrar a la clnica hoy . Su embarazo va bien . Ahora mismo ests 28 semanas y 5 [address]  [address] en su [address]. El beb se ve bien en el ltimo ultrasonido . No hay preocupaciones . Continuar prenatal vitamina diaria  Para el dolor de espalda - creo que esto es un espasmo muscular . Tomar Tylenol 500 mg ( 2 comprimidos ) cada 6 a 8 horas - ( en total 3 veces al da - un mximo de 6 comprimidos en 24 horas ) . Siga usando toallero en su espalda. Mantente hidratado. Para el pie - se puede utilizar una botella de agua congelada y Designer, television/film setestirar la parte inferior de su pie . Tylenol ayudar esto tambin  Continuar trabajando en comer con ms regularidad , pequeas comidas y bocadillos . Los alimentos ricos en protenas . Beba mucha agua para evitar la deshidratacin que puede causar contracciones tempranas .  Por favor, programar la prxima cita conmigo ( Dr. Althea CharonKaramalegos ) para prenatal de rutina en 3 a 4  semanas - alrededor de 32 semanas de [place]arazo .  Si usted tiene alguna preocupacin significativas o fugas de fluidos, contracciones regulares , por favor vaya directamente a la Unidad de Admisin del hospital de maternidad de las mujeres .

## 2015-02-11 NOTE — Progress Notes (Signed)
History provided by patient in Spanish with assistance of language line [name] Remi Deter(Samuel ID [id number]) and also in person for 2nd half of exam with Graciela. S: [name] Freeman is a 24 y.o. G2P1001 at 5861w5d   Today reports overall doing well. Endorses Right sided mid back pain for past 4 days, worse with prolonged standing. Also recently seen for itching with hives on her feet, given topical triamcinolone with resolution of hives now only mild localized intermittently painful swelling where hive used to be on side of her left foot. - Taking PNV daily. She is taking Tylenol 500mg  every 5 hours with mild relief. Tried a hot towel with relief. - Weight gain appropriate, some reduced appetite occasionally not eating full meals - Admits to back pain, mild hemorrhoids (improved with topical cream) - Denies any fevers/chills, nausea, vomiting, headaches, vision changes, worsening edema, dysuria  Fetal Movement - daily Pain / Contractions - occasional pelvic pain, irregular       Denies any fluid leakage, vaginal bleeding, or discharge  O: BP 101/64 mmHg  Pulse 63  Temp(Src) 98.6 F (37 C)  Wt 114 lb (51.71 kg)  LMP 07/07/2014 (Approximate)  Gen - well-appearing, NAD Abd - soft, appropriately gravid for GA, NTND, +active BS MSK - Right lumbar paraspinal muscle tenderness to palpation with mild hypertonicity, negative b/l seated SLR Ext - minimal tenderness over focal area of medial aspect of left mid foot without any erythema or evidence of prior urticaria, no pedal or lower ext edema, peripheral pulses intact +2 b/l Skin - warm, dry, no rashes and resolved urticaria  A&P: Normal supervision of pregnancy, in early 3rd trimester, at 2561w5d - cont daily PNV - Right low back/flank pain - Presumed MSK pain in pregnancy with +TTP (neg SLR, no radicular symptoms), increase Tylenol to 500-1000mg  q 8 hr PRN (max 6 tabs in 24 hours), moist heat. Consider brief trial Flexeril in future if  significant worsening. - TDap received today - Labs collected - HIV, RPR, CBC, Varicella titer, 1 hr GTT (passed, 111, note late check at 1 hr 10 min) - fetal movement and labor precautions reviewed  PMH Form completed (28 wks) - overall negative. No risk factors or concerns about safety or abuse. No high risk pregnancy features. PHQ-9: score 10, not very difficult  Return in 3-4 weeks for next Routine OB Visit / - at [redacted] wks GA

## 2015-02-12 LAB — RPR

## 2015-02-12 LAB — HIV ANTIBODY (ROUTINE TESTING W REFLEX): HIV 1&2 Ab, 4th Generation: NONREACTIVE

## 2015-02-14 LAB — VARICELLA ZOSTER ANTIBODY, IGG: Varicella IgG: 259.3 Index — ABNORMAL HIGH (ref <135.00)

## 2015-03-07 ENCOUNTER — Ambulatory Visit (INDEPENDENT_AMBULATORY_CARE_PROVIDER_SITE_OTHER): Payer: Self-pay | Admitting: Family Medicine

## 2015-03-07 VITALS — BP 98/59 | HR 73 | Temp 98.2°F | Wt 118.0 lb

## 2015-03-07 DIAGNOSIS — Z3483 Encounter for supervision of other normal pregnancy, third trimester: Secondary | ICD-10-CM

## 2015-03-07 DIAGNOSIS — O99013 Anemia complicating pregnancy, third trimester: Secondary | ICD-10-CM

## 2015-03-07 DIAGNOSIS — D509 Iron deficiency anemia, unspecified: Secondary | ICD-10-CM

## 2015-03-07 MED ORDER — FERROUS SULFATE 325 (65 FE) MG PO TABS: 325.0000 mg | ORAL_TABLET | Freq: Every day | ORAL | Status: DC

## 2015-03-07 NOTE — Patient Instructions (Signed)
Thank you for coming into clinic today. Your pregnancy is going well. You are currently 32 weeks and 1 days into your pregnancy. Continue Prenatal Vitamin daily  Recommend Ensure or Boost meal supplement shake. Drink one daily.  If you get pain again, may take Tylenol 500mg  (2 tablets) every 6 to 8 hours - (Total 3 times a day - maximum 6 tablets in 24 hours). Keep using heated towel on your back. Stay hydrated.  Continue to work on eating more regularly, small meals and snacks. High protein foods. Drink plenty of water to avoid dehydration which can cause early contractions.  Please schedule [facility] for ROUTINE PRENATAL in 2 weeks - around 34 weeks pregnancy.  ---------  Susan PockGracias por entrar a la clnica hoy . Su embarazo va bien . Ahora mismo ests 32 semanas y 1 dias en su embarazo. Continuar prenatal vitamina diaria  Recomiendan Ensure o Boost batido de suplemento de comida. Beber una diaria .  Si obtiene un dolor ms, puede tomar Tylenol 500 mg ( 2 comprimidos ) cada 6 a 8 horas - ( en total 3 veces al da - un mximo de 6 comprimidos en 24 horas ) . Siga usando toallero en su espalda. Mantente hidratado.  Continuar trabajando en comer con ms regularidad , pequeas comidas y bocadillos . Los alimentos ricos en protenas . Beba mucha agua para evitar la deshidratacin que puede causar contracciones tempranas .  Por favor, programar la prxima cita con [facility] de prenatal de rutina en 2 semanas - alrededor de 34 semanas de Big Lotsembarazo .   Tercer trimestre de Psychiatristembarazo (Third Trimester of Pregnancy) El tercer trimestre va desde la semana29 hasta la 42, desde el sptimo hasta el noveno mes, y es la poca en la que el feto crece ms rpidamente. Hacia el final del noveno mes, el feto mide alrededor de 20pulgadas (45cm) de largo y pesa entre 6 y 10 libras (2,700 y 404,500kg).  CAMBIOS EN EL ORGANISMO Su organismo atraviesa por muchos  cambios durante el Warrenembarazo, y estos varan de Neomia Dearuna mujer a Educational psychologistotra.   Seguir American Standard Companiesaumentando de peso. Es de esperar que aumente entre 25 y 35libras (11 y 16kg) hacia el final del Psychiatristembarazo.  Podrn aparecer las primeras Albertson'sestras en las caderas, el abdomen y las [place] Springsmamas.  Puede tener necesidad de Geographical information systems officerorinar con ms frecuencia porque el feto baja hacia la pelvis y ejerce presin sobre la vejiga.  Debido al Vanetta Muldersembarazo podr sentir Anthoney Haradaacidez estomacal con frecuencia.  Puede estar estreida, ya que ciertas hormonas enlentecen los movimientos de los msculos que New York Life Insuranceempujan los desechos a travs de los intestinos.  Pueden aparecer hemorroides o abultarse e hincharse las venas (venas varicosas).  Puede sentir dolor plvico debido al Con-wayaumento de peso y a que las hormonas del Management consultantembarazo relajan las articulaciones entre los huesos de la pelvis. El dolor de espalda puede ser consecuencia de la sobrecarga de los msculos que soportan la Vassarpostura.  Tal vez haya cambios en el cabello que pueden incluir su engrosamiento, crecimiento rpido y cambios en la textura. Adems, a algunas mujeres se les cae el cabello durante o despus del embarazo, o tienen el cabello seco o fino. Lo ms probable es que el cabello se le normalice despus del nacimiento del beb.  Las ConAgra Foodsmamas seguirn creciendo y Development worker, communityle dolern. A veces, puede haber una secrecin amarilla de las mamas llamada calostro.  El ombligo puede salir hacia afuera.  Puede sentir Tree surgeonque le falta  el aire debido a que se Dealer.  Puede notar que el feto "baja" o lo siente ms bajo, en el abdomen.  Puede tener una prdida de secrecin mucosa con sangre. Esto suele ocurrir en el trmino de unos [address] a una semana antes de que comience el [place].  El cuello del tero se vuelve delgado y blando (se borra) cerca de la fecha de [place]. QU DEBE ESPERAR EN LOS EXMENES PRENATALES  Le harn exmenes prenatales cada 2semanas hasta la semana36. A partir de ese momento le  harn exmenes semanales. Durante una visita prenatal de rutina:  La pesarn para asegurarse de que usted y el feto estn creciendo normalmente.  Le tomarn la presin arterial.  Le medirn el abdomen para controlar el desarrollo del beb.  Se escucharn los latidos cardacos fetales.  Se evaluarn los resultados de los estudios solicitados en visitas anteriores.  Le revisarn el cuello del tero cuando est prxima la fecha de parto para controlar si este se ha borrado. Alrededor de la semana36, el mdico le revisar el cuello del tero. Al mismo tiempo, realizar un anlisis de las secreciones del tejido vaginal. Este examen es para determinar si hay un tipo de bacteria, estreptococo Grupo B. El mdico le explicar esto con ms detalle. El mdico puede preguntarle lo siguiente:  Cmo le gustara que fuera el [place].  Cmo se siente.  Si siente los movimientos del beb.  Si ha tenido sntomas anormales, como prdida de lquido, [place], dolores de cabeza intensos o clicos abdominales.  Si tiene [employer]. Otros exmenes o estudios de deteccin que pueden realizarse durante el tercer trimestre incluyen lo siguiente:  Anlisis de sangre para controlar las concentraciones de hierro (anemia).  Controles fetales para determinar su salud, nivel de [place] y Designer, jewellery. Si tiene [place] enfermedad o hay problemas durante el embarazo, le harn estudios. FALSO TRABAJO DE PARTO Es posible que sienta contracciones leves e irregulares que finalmente desaparecen. Se llaman contracciones de [address] o falso trabajo de [place]. Las [employer] pueden durar horas, [address] o incluso semanas, antes de que el verdadero trabajo de parto se inicie. Si las contracciones ocurren a intervalos regulares, se intensifican o se hacen dolorosas, lo mejor es que la revise el mdico.  SIGNOS DE TRABAJO DE PARTO   Clicos de tipo menstrual.  Contracciones cada o menos.  Contracciones que  comienzan en la parte superior del tero y se extienden hacia abajo, a la zona inferior del abdomen y la espalda.  Sensacin de mayor presin en la pelvis o dolor de espalda.  Una secrecin de mucosidad acuosa o con sangre que sale de la vagina. Si tiene alguno de estos signos antes de la semana37 del Psychiatrist, llame a su mdico de inmediato. Debe concurrir al hospital para que la controlen inmediatamente. INSTRUCCIONES PARA EL CUIDADO EN EL HOGAR   Evite fumar, consumir hierbas, beber alcohol y tomar frmacos que no le hayan recetado. Estas sustancias qumicas afectan la formacin y el desarrollo del beb.  Siga las indicaciones del mdico en relacin con el uso de medicamentos. Durante el embarazo, hay medicamentos que son seguros de tomar y otros que no.  Haga actividad fsica solo en la forma indicada por el mdico. Sentir clicos uterinos es un buen signo para Restaurant manager, fast food actividad fsica.  Contine comiendo alimentos que sanos con regularidad.  Use un sostn que le brinde buen soporte si le [employer].  No se d baos de inmersin en  agua caliente, baos turcos ni saunas.  Colquese el cinturn de seguridad cuando conduzca.  No coma carne cruda ni queso sin cocinar; evite el contacto con las bandejas sanitarias de los gatos y la tierra que estos animales usan. Estos elementos contienen grmenes que pueden causar defectos congnitos en el beb.  Tome las vitaminas prenatales.  Si est estreida, pruebe un laxante suave (si el mdico lo autoriza). Consuma ms alimentos ricos en fibra, como vegetales y frutas frescos y Radiation protection practitionercereales integrales. Beba gran cantidad de lquido para mantener la orina de tono claro o color amarillo plido.  Dese baos de asiento con agua tibia para Engineer, materialsaliviar el dolor o las molestias causadas por las hemorroides. Use una crema para las hemorroides si el mdico la autoriza.  Si tiene venas varicosas, use medias de descanso. Eleve los pies durante 15minutos,  3 o 4veces por da. Limite la cantidad de sal en su dieta.  Evite levantar objetos pesados, use zapatos de tacones bajos y Brazilmantenga una buena postura.  Descanse con las piernas elevadas si tiene calambres o dolor de cintura.  Visite a su dentista si no lo ha Occupational hygienisthecho durante el embarazo. Use un cepillo de dientes blando para higienizarse los dientes y psese el hilo dental con suavidad.  Puede seguir Calpine Corporationmanteniendo relaciones sexuales, a menos que el mdico le indique lo contrario.  No haga viajes largos excepto que sea absolutamente necesario y solo con la autorizacin del mdico.  Tome clases prenatales para Financial traderentender, Education administratorpracticar y hacer preguntas sobre el St. Petertrabajo de parto y Ardenel parto.  Haga un ensayo de la partida al hospital.  Prepare el bolso que llevar al hospital.  Prepare la habitacin del beb.  Concurra a todas las visitas prenatales segn las indicaciones de su mdico. SOLICITE ATENCIN MDICA SI:  No est segura de que est en trabajo de parto o de que ha roto la bolsa de las aguas.  Tiene mareos.  Siente clicos leves, presin en la pelvis o dolor persistente en el abdomen.  Tiene nuseas, vmitos o diarrea persistentes.  Tiene secrecin vaginal con mal olor.  Siente dolor al ConocoPhillipsorinar. SOLICITE ATENCIN MDICA DE INMEDIATO SI:   Tiene fiebre.  Tiene una prdida de lquido por la vagina.  Tiene sangrado o pequeas prdidas vaginales.  Siente dolor intenso o clicos en el abdomen.  Sube o baja de peso rpidamente.  Tiene dificultad para respirar y siente dolor de pecho.  Sbitamente se le hinchan mucho el rostro, las Needmoremanos, los tobillos, los pies o las piernas.  No ha sentido los movimientos del beb durante Georgianne Fickuna hora.  Siente un dolor de cabeza intenso que no se alivia con medicamentos.  Hay cambios en la visin. Document Released: 07/25/2005 Document Revised: 10/20/2013 [facility] Patient Information 2015 [place]ExitCare, [place]. This information is not intended to  replace advice given to you by your health care provider. Make sure you discuss any questions you have with your health care provider.

## 2015-03-07 NOTE — Progress Notes (Signed)
History provided by patient in Spanish with assistance of language line [name], nursing staff recorded details on ID#. S: Susan Freeman is a 24 y.o. G2P1001 at 4173w1d   Today reports overall doing well. Reports that her R-low back pain has since resolved after last visit (02/11/15). - Taking PNV daily. No longer taking Tylenol regularly, using PRN. - Weight gain appropriate, +4 lbs, continued reduced appetite still but trying to eat small frequent meals. - Hemorrhoids improved with topical cream - Denies any fevers/chills, nausea, vomiting, headaches, vision changes, worsening edema, dysuria  Fetal Movement - daily Pain / Contractions - none Denies any fluid leakage, vaginal bleeding, or discharge  O: BP 98/59 mmHg  Pulse 73  Temp(Src) 98.2 F (36.8 C)  Wt 118 lb (53.524 kg)  LMP 07/07/2014 (Approximate)  Gen - well-appearing, NAD Abd - soft, appropriately gravid for GA, NTND, +active BS  A&P: Normal supervision of pregnancy, in early 3rd trimester, at 3773w1d - Plan for breastfeeding, requests Mirena IUD (will need scholarship approval at 6 wk PP follow-up) - cont daily PNV, may use Tylenol PRN - Advised increased PO intake with small frequent meals / snacks, inc protein, may try Ensure shake supplement daily - fetal movement and labor precautions reviewed  Iron deficiency Anemia in Pregnancy - Hgb 10.9, nml MCV, previously low nml b/l Hgb 12s - Continue PNV - Add iron supplement PO 325mg  daily with food - sent rx to pharmacy  [facility] - at [redacted] wks GA. - Following apt 2 weeks after next visit (@ 36 wks), with me for GBS probe.

## 2015-03-24 ENCOUNTER — Ambulatory Visit (INDEPENDENT_AMBULATORY_CARE_PROVIDER_SITE_OTHER): Payer: No Typology Code available for payment source | Admitting: Family Medicine

## 2015-03-24 ENCOUNTER — Telehealth: Payer: Self-pay | Admitting: Family Medicine

## 2015-03-24 VITALS — BP 106/30 | HR 80 | Temp 98.2°F | Wt 121.0 lb

## 2015-03-24 DIAGNOSIS — Z3493 Encounter for supervision of normal pregnancy, unspecified, third trimester: Secondary | ICD-10-CM

## 2015-03-24 NOTE — Patient Instructions (Signed)
Cuidados prenatales  (Prenatal Care ) QU SON LOS CUIDADOS PRENATALES?  Los cuidados prenatales se relacionan con la atencin de la salud durante el embarazo, antes de que nazca el beb. Durante el embarazo, es muy importante que haga lo siguiente para cuidar de usted y de su beb:   Recibir cuidados prenatales de forma temprana. Si sabe que est embarazada, o cree que podra estarlo, llame a su mdico lo antes posible. Programe una visita para un examen prenatal.  Recibir cuidados prenatales con regularidad. Cumpla con el esquema de anlisis de sangre y otros estudios necesarios que le indique su mdico. No falte a las citas.  Hacer todo lo posible para que usted y su beb estn sanos durante el embarazo.  Recibir todos los cuidados necesarios. Los cuidados prenatales deben incluir la evaluacin de las necesidades mdicas, nutricionales, educativas, psicolgicas y sociales de la embarazada y su pareja. Debe hablar con su mdico sobre la historia clnica y gentica de su familia y de la familia del padre del beb.  Informar a su mdico sobre lo siguiente:  Los medicamentos recetados, de venta libre y a base de hierbas que toma.  Cualquier antecedente de abuso de sustancias, consumo de alcohol, hbito de fumar y uso de drogas ilegales.  Cualquier antecedente de violencia domstica o de otro tipo.  Las vacunas que ha recibido.  Su nutricin y su dieta.  La cantidad de ejercicio que hace.  Cualquier peligro ambiental y ocupacional al que est expuesta.  Antecedentes de infecciones de transmisin sexual, tanto suyos como de su pareja.  Embarazos previos. POR QU SON TAN IMPORTANTES LOS CUIDADOS PRENATALES?  Al visitar con regularidad a su mdico, usted ayuda a garantizar que los problemas se identifiquen de forma temprana, para poder tratarlos lo antes posible. Tambin podran prevenirse otros problemas. Muchos estudios han demostrado que los cuidados prenatales brindados de forma  temprana y con regularidad son importantes para la salud de las madres y los bebs.  CMO PUEDO CUIDARME DURANTE EL EMBARAZO?  Puede cuidar de usted y de su beb de los siguientes modos:   Comience a tomar un multivitamnico con 400microgramos (mcg) de cido flico todos los das, o siga tomndolo.  Reciba cuidados prenatales de forma temprana y con regularidad. Es muy importante que vea a un mdico durante el embarazo. Su mdico la examinar en cada visita para asegurarse de que usted y el beb estn sanos. Si hay algn problema, se puede actuar de inmediato para ayudarlos a usted y al beb.  Consuma una dieta saludable que incluya:  Frutas.  Verduras.  Alimentos con bajo contenido de grasas saturadas.  Cereales integrales.  Alimentos con alto contenido de calcio, como leche, yogur y quesos duros.  Beba entre 6 y 8 vasos de lquidos por da.  A menos que su mdico le indique otra cosa, intente hacer actividad fsica durante 30minutos, todos los das. Si no le alcanza el tiempo, puede hacer actividad fsica en perodos de 10minutos, tres veces al da.  No fume, no beba alcohol ni consuma drogas. Estos pueden causarle daos a largo plazo al beb. Hable con su mdico sobre los pasos a seguir para dejar de fumar. Hable con un miembro de su comunidad religiosa, un orientador, un amigo de confianza o con su mdico en el caso de que consuma alcohol o drogas y est preocupada al respecto.  Pregntele a su mdico antes de tomar cualquier medicamento, incluso los de venta libre. No es seguro tomar algunos medicamentos durante el embarazo.    Descanse y duerma lo suficiente.  Evite jacuzzis y saunas durante el embarazo.  No se haga radiografas, a menos que sean absolutamente necesarias y que se las indique su mdico. Pueden colocarle una pantalla de plomo sobre el abdomen para proteger al beb cuando le hagan radiografas en otras partes del cuerpo.  No limpie la arena higinica para gatos  durante el embarazo. Puede contener un parsito que causa una infeccin llamada toxoplasmosis, que puede provocar defectos congnitos. Adems, use guantes al trabajar en reas del jardn que usen los gatos.  No coma carne de vaca, pollo o pescado que est cruda o poco cocida.  No coma quesos madurados con hongos (brie, camembert y de cabra) ni quesos azules blandos (dans y roquefort).  Aljese de sustancias qumicas txicas como las siguientes:  Insecticidas.  Solventes (algunos agentes de limpieza o diluyentes de pintura).  Plomo.  Mercurio.  Puede tener relaciones sexuales hasta el final del embarazo, excepto si tiene algn problema mdico o alguna dificultad en el embarazo y su mdico le indica que no las tenga.  No use tacones altos, especialmente durante la segunda mitad del embarazo. Puede perder el equilibrio y caerse.  No haga viajes largos, a menos que sean absolutamente necesarios. Asegrese de visitar al mdico antes de hacer el viaje.  No permanezca sentada en una posicin durante ms de 2 horas cuando viaje.  Lleve una copia de su historia clnica cuando viaje. Averige adnde hay un hospital en la ciudad que visitar, en caso de emergencia.  La mayora de los productos de limpieza tienen advertencias sobre el embarazo en la etiqueta. Si no est segura, consulte a su mdico acerca de los productos.  Limite o elimine la cafena de su dieta, evite el caf, t, bebidas gaseosas, medicamentos y chocolate.  Muchas mujeres siguen trabajando durante el embarazo. Estar activa puede ayudarla a estar ms sana. Si tiene dudas sobre la seguridad de un trabajo especfico o las horas que debera trabajar, hable con su mdico.  Infrmese:  Lea libros.  Mire videos.  Vaya a las clases de preparto con su pareja.  Hable con mams experimentadas.  Pregntele a su mdico sobre las clases preparto para usted y su pareja. Las clases la ayudarn a usted y a su compaero a prepararse  para el nacimiento de su beb.  Busque un mdico de bebs (pediatra) y consulte acerca de los mtodos y los medicamentos para aliviar el dolor durante el trabajo de parto, el parto y en el caso de que se le haga una cesrea. CON QU FRECUENCIA DEBO VISITAR A MI MDICO DURANTE EL EMBARAZO?  Su mdico le dar un esquema con las visitas prenatales. Las visitas sern ms frecuentes a medida que se acerque el final del embarazo. Un embarazo promedio dura aproximadamente de 40semanas.  Un esquema habitual incluye visitar al mdico con la siguiente frecuencia:   Aproximadamente una vez al mes durante los primeros 6meses de embarazo.  Cada 2semanas durante los prximos 2meses.  Una vez por semana durante el ltimo mes, hasta la fecha de parto. Es probable que su mdico quiera verla ms a menudo en los siguientes casos:  Tiene ms de 35aos.  Su embarazo es de alto riesgo debido a que usted tiene ciertos problemas de salud o con el embarazo, por ejemplo:  Diabetes.  Hipertensin arterial.  El beb no se desarrolla como debera, segn las fechas del embarazo. Su mdico le har estudios especiales para asegurarse de que usted y el beb no tengan   problemas graves. QU SUCEDE DURANTE LAS VISITAS PRENATALES?   En la primera visita prenatal, su mdico le har un examen fsico y hablar con usted sobre sus antecedentes mdicos, los de su pareja y su familia. Su mdico podr decirle la fecha probable de parto.  Su primer examen fsico incluir controles de la presin arterial, el peso y la altura, y un examen de los rganos de la pelvis. Su mdico le har un Papanicolaou si no le han hecho uno recientemente y har cultivos del cuello del tero para descartar infecciones.  En cada visita prenatal, le harn anlisis de sangre y orina, le tomarn la presin arterial, le controlarn el peso y verificarn el desarrollo del beb.  En sus ltimas visitas prenatales, su mdico examinar su estado y el  desarrollo del beb. Es posible que le hagan varios estudios a medida que avance el embarazo.  A menudo, se hacen ecografas para verificar el crecimiento y la salud del beb.  Puede ser que le hagan ms anlisis de sangre y orina, adems de estudios especiales, segn sea necesario. Estos pueden incluir una amniocentesis para examinar el lquido del saco amnitico, pruebas de estrs para ver cmo responde el beb a las contracciones o un perfil biofsico para controlar el bienestar del beb. Su mdico le explicar los estudios y la necesidad de cada uno.  Entre la semana 24 y 28 de embarazo, le harn un anlisis para saber si tiene un nivel alto de azcar en la sangre (diabetes gestacional).  Debe hablar con el mdico sobre sus planes de amamantar o darle el bibern a su beb.  En cada visita, tambin tendr la posibilidad de aprender a mantenerse saludable durante el embarazo y de hacer preguntas. Document Released: 04/02/2008 Document Revised: 10/20/2013 ExitCare Patient Information 2015 ExitCare, LLC. This information is not intended to replace advice given to you by your health care provider. Make sure you discuss any questions you have with your health care provider.  

## 2015-03-24 NOTE — Progress Notes (Signed)
Addendum: Patient returned for Bp check. Here BP now is 106/60. She denies any symptoms. I explained to her at time BP can be low in pregnancy, but I was concern about the initial diastolic BP of 30 which has now improved. Counseling done on diet to improve her weight gain and continue PNV and iron pill to help with her blood level. Return precaution discussed which include dizziness, fatigue. She verbalized understanding.  Interpreter used.

## 2015-03-24 NOTE — Telephone Encounter (Signed)
Patient seen today at the clinic, diastolic BP is low. Recheck BP not done before she left. She was contacted by me for her to return for recheck with the help of an interpreter. She confirmed she will return today for recheck. Patient is otherwise doing well.

## 2015-03-24 NOTE — Progress Notes (Addendum)
Susan Freeman is a 24 y.o. G2P1001 at 2230w4d for routine follow up.  She reports occasional feet and hand swelling, she denies headache, no N/V, no belly pain or contraction. No dizziness. She uses PNV regularly.  Also see flow sheet for details.  Exam: Gen:Not in distress. Resp: CTA B/L. Heart: S1 S2 normal, no murmur. Abd: Gravid. FH: 32 cm, FHR 152 bpm. Bedside U/S showed single intrauterine fetus with vertex presentation. Ext: No pedal or hand edema.   A/P: Pregnancy at 9330w4d.  Doing well.   Pregnancy issues include: Initially with poor weight gain but now improving. Her appetite is light but adequate. Normal fetal heart tone. Counseling regarding hand and feet swelling in pregnancy. Currently she does not have any swelling, we will monitor for now. Diastolic BP low. BP has been on the low side. Patient asymptomatic. Plan to recheck BP.  Infant feeding choice : Breast feed. Contraception choice: IUD Infant circumcision desired: no  Tdapwas given in previous visit. GBS/GC/CZ testing was not performed today. RTC in 2wk for GBS.  Preterm labor precautions reviewed. Safe sleep discussed. Kick counts reviewed. Follow up 2 weeks.

## 2015-03-25 ENCOUNTER — Encounter: Payer: No Typology Code available for payment source | Admitting: Family Medicine

## 2015-04-08 ENCOUNTER — Other Ambulatory Visit (HOSPITAL_COMMUNITY)
Admission: RE | Admit: 2015-04-08 | Discharge: 2015-04-08 | Disposition: A | Discharge status: Home / Self Care | Payer: No Typology Code available for payment source | Source: Ambulatory Visit | Attending: Family Medicine | Admitting: Family Medicine

## 2015-04-08 ENCOUNTER — Ambulatory Visit (INDEPENDENT_AMBULATORY_CARE_PROVIDER_SITE_OTHER): Payer: Self-pay | Admitting: Family Medicine

## 2015-04-08 VITALS — BP 111/59 | HR 70 | Temp 98.2°F | Wt 124.3 lb

## 2015-04-08 DIAGNOSIS — Z3483 Encounter for supervision of other normal pregnancy, third trimester: Secondary | ICD-10-CM

## 2015-04-08 DIAGNOSIS — Z113 Encounter for screening for infections with a predominantly sexual mode of transmission: Secondary | ICD-10-CM | POA: Insufficient documentation

## 2015-04-08 NOTE — Progress Notes (Signed)
Susan Freeman is a 24 y.o. G2P1001 at [redacted]w[redacted]d for routine follow up.  She reports dizziness in mornings, lasts for a couple hours if she does not eat or rest. She has never fallen b/c of this.  See flow sheet for details. Some discharge. Similar to her last pregnancy. No odor, dysuria, or bloody discharge.   A/P: Pregnancy at [redacted]w[redacted]d.  Doing well.   Pregnancy issues include some swelling in her feet, and occasional dizziness  Infant feeding choice Breastfeeding Contraception choice IUD -- has discussed w/ Dr. Althea Charon Infant circumcision desired no  Tdap was not given today. GBS/GC/CZ testing was performed today.  Preterm labor precautions reviewed. Safe sleep discussed. Kick counts reviewed. Follow up 2 weeks.

## 2015-04-08 NOTE — Patient Instructions (Signed)
Tercer trimestre de embarazo (Third Trimester of Pregnancy) El tercer trimestre va desde la semana29 hasta la 42, desde el sptimo hasta el noveno mes, y es la poca en la que el feto crece ms rpidamente. Hacia el final del noveno mes, el feto mide alrededor de 20pulgadas (45cm) de largo y pesa entre 6 y 10 libras (2,700 y 4,500kg).  CAMBIOS EN EL ORGANISMO Su organismo atraviesa por muchos cambios durante el embarazo, y estos varan de una mujer a otra.   Seguir aumentando de peso. Es de esperar que aumente entre 25 y 35libras (11 y 16kg) hacia el final del embarazo.  Podrn aparecer las primeras estras en las caderas, el abdomen y las mamas.  Puede tener necesidad de orinar con ms frecuencia porque el feto baja hacia la pelvis y ejerce presin sobre la vejiga.  Debido al embarazo podr sentir acidez estomacal con frecuencia.  Puede estar estreida, ya que ciertas hormonas enlentecen los movimientos de los msculos que empujan los desechos a travs de los intestinos.  Pueden aparecer hemorroides o abultarse e hincharse las venas (venas varicosas).  Puede sentir dolor plvico debido al aumento de peso y a que las hormonas del embarazo relajan las articulaciones entre los huesos de la pelvis. El dolor de espalda puede ser consecuencia de la sobrecarga de los msculos que soportan la postura.  Tal vez haya cambios en el cabello que pueden incluir su engrosamiento, crecimiento rpido y cambios en la textura. Adems, a algunas mujeres se les cae el cabello durante o despus del embarazo, o tienen el cabello seco o fino. Lo ms probable es que el cabello se le normalice despus del nacimiento del beb.  Las mamas seguirn creciendo y le dolern. A veces, puede haber una secrecin amarilla de las mamas llamada calostro.  El ombligo puede salir hacia afuera.  Puede sentir que le falta el aire debido a que se expande el tero.  Puede notar que el feto "baja" o lo siente ms bajo, en el  abdomen.  Puede tener una prdida de secrecin mucosa con sangre. Esto suele ocurrir en el trmino de unos pocos das a una semana antes de que comience el trabajo de parto.  El cuello del tero se vuelve delgado y blando (se borra) cerca de la fecha de parto. QU DEBE ESPERAR EN LOS EXMENES PRENATALES  Le harn exmenes prenatales cada 2semanas hasta la semana36. A partir de ese momento le harn exmenes semanales. Durante una visita prenatal de rutina:  La pesarn para asegurarse de que usted y el feto estn creciendo normalmente.  Le tomarn la presin arterial.  Le medirn el abdomen para controlar el desarrollo del beb.  Se escucharn los latidos cardacos fetales.  Se evaluarn los resultados de los estudios solicitados en visitas anteriores.  Le revisarn el cuello del tero cuando est prxima la fecha de parto para controlar si este se ha borrado. Alrededor de la semana36, el mdico le revisar el cuello del tero. Al mismo tiempo, realizar un anlisis de las secreciones del tejido vaginal. Este examen es para determinar si hay un tipo de bacteria, estreptococo Grupo B. El mdico le explicar esto con ms detalle. El mdico puede preguntarle lo siguiente:  Cmo le gustara que fuera el parto.  Cmo se siente.  Si siente los movimientos del beb.  Si ha tenido sntomas anormales, como prdida de lquido, sangrado, dolores de cabeza intensos o clicos abdominales.  Si tiene alguna pregunta. Otros exmenes o estudios de deteccin que pueden realizarse   durante el tercer trimestre incluyen lo siguiente:  Anlisis de sangre para controlar las concentraciones de hierro (anemia).  Controles fetales para determinar su salud, nivel de actividad y crecimiento. Si tiene alguna enfermedad o hay problemas durante el embarazo, le harn estudios. FALSO TRABAJO DE PARTO Es posible que sienta contracciones leves e irregulares que finalmente desaparecen. Se llaman contracciones de  Braxton Hicks o falso trabajo de parto. Las contracciones pueden durar horas, das o incluso semanas, antes de que el verdadero trabajo de parto se inicie. Si las contracciones ocurren a intervalos regulares, se intensifican o se hacen dolorosas, lo mejor es que la revise el mdico.  SIGNOS DE TRABAJO DE PARTO   Clicos de tipo menstrual.  Contracciones cada 5minutos o menos.  Contracciones que comienzan en la parte superior del tero y se extienden hacia abajo, a la zona inferior del abdomen y la espalda.  Sensacin de mayor presin en la pelvis o dolor de espalda.  Una secrecin de mucosidad acuosa o con sangre que sale de la vagina. Si tiene alguno de estos signos antes de la semana37 del embarazo, llame a su mdico de inmediato. Debe concurrir al hospital para que la controlen inmediatamente. INSTRUCCIONES PARA EL CUIDADO EN EL HOGAR   Evite fumar, consumir hierbas, beber alcohol y tomar frmacos que no le hayan recetado. Estas sustancias qumicas afectan la formacin y el desarrollo del beb.  Siga las indicaciones del mdico en relacin con el uso de medicamentos. Durante el embarazo, hay medicamentos que son seguros de tomar y otros que no.  Haga actividad fsica solo en la forma indicada por el mdico. Sentir clicos uterinos es un buen signo para detener la actividad fsica.  Contine comiendo alimentos que sanos con regularidad.  Use un sostn que le brinde buen soporte si le duelen las mamas.  No se d baos de inmersin en agua caliente, baos turcos ni saunas.  Colquese el cinturn de seguridad cuando conduzca.  No coma carne cruda ni queso sin cocinar; evite el contacto con las bandejas sanitarias de los gatos y la tierra que estos animales usan. Estos elementos contienen grmenes que pueden causar defectos congnitos en el beb.  Tome las vitaminas prenatales.  Si est estreida, pruebe un laxante suave (si el mdico lo autoriza). Consuma ms alimentos ricos en  fibra, como vegetales y frutas frescos y cereales integrales. Beba gran cantidad de lquido para mantener la orina de tono claro o color amarillo plido.  Dese baos de asiento con agua tibia para aliviar el dolor o las molestias causadas por las hemorroides. Use una crema para las hemorroides si el mdico la autoriza.  Si tiene venas varicosas, use medias de descanso. Eleve los pies durante 15minutos, 3 o 4veces por da. Limite la cantidad de sal en su dieta.  Evite levantar objetos pesados, use zapatos de tacones bajos y mantenga una buena postura.  Descanse con las piernas elevadas si tiene calambres o dolor de cintura.  Visite a su dentista si no lo ha hecho durante el embarazo. Use un cepillo de dientes blando para higienizarse los dientes y psese el hilo dental con suavidad.  Puede seguir manteniendo relaciones sexuales, a menos que el mdico le indique lo contrario.  No haga viajes largos excepto que sea absolutamente necesario y solo con la autorizacin del mdico.  Tome clases prenatales para entender, practicar y hacer preguntas sobre el trabajo de parto y el parto.  Haga un ensayo de la partida al hospital.  Prepare el bolso que   llevar al hospital.  Prepare la habitacin del beb.  Concurra a todas las visitas prenatales segn las indicaciones de su mdico. SOLICITE ATENCIN MDICA SI:  No est segura de que est en trabajo de parto o de que ha roto la bolsa de las aguas.  Tiene mareos.  Siente clicos leves, presin en la pelvis o dolor persistente en el abdomen.  Tiene nuseas, vmitos o diarrea persistentes.  Tiene secrecin vaginal con mal olor.  Siente dolor al orinar. SOLICITE ATENCIN MDICA DE INMEDIATO SI:   Tiene fiebre.  Tiene una prdida de lquido por la vagina.  Tiene sangrado o pequeas prdidas vaginales.  Siente dolor intenso o clicos en el abdomen.  Sube o baja de peso rpidamente.  Tiene dificultad para respirar y siente dolor de  pecho.  Sbitamente se le hinchan mucho el rostro, las manos, los tobillos, los pies o las piernas.  No ha sentido los movimientos del beb durante una hora.  Siente un dolor de cabeza intenso que no se alivia con medicamentos.  Hay cambios en la visin. Document Released: 07/25/2005 Document Revised: 10/20/2013 ExitCare Patient Information 2015 [employer]. This information is not intended to replace advice given to you by your health care provider. Make sure you discuss any questions you have with your health care provider.  

## 2015-04-08 NOTE — Addendum Note (Signed)
Addended by: Herminio Heads on: 04/08/2015 04:39 PM   Modules accepted: Orders

## 2015-04-10 LAB — STREP B DNA PROBE: STREP GROUP B AG: DETECTED

## 2015-04-12 LAB — CERVICOVAGINAL ANCILLARY ONLY
Chlamydia: NEGATIVE
Neisseria Gonorrhea: NEGATIVE

## 2015-04-22 ENCOUNTER — Ambulatory Visit (INDEPENDENT_AMBULATORY_CARE_PROVIDER_SITE_OTHER): Payer: No Typology Code available for payment source | Admitting: Family Medicine

## 2015-04-22 VITALS — BP 111/80 | HR 88 | Temp 97.6°F | Wt 127.7 lb

## 2015-04-22 DIAGNOSIS — Z3483 Encounter for supervision of other normal pregnancy, third trimester: Secondary | ICD-10-CM

## 2015-04-22 NOTE — Progress Notes (Signed)
Alissya Bettenhausen is a 23 y.o. G2P1001 at [redacted]w[redacted]d for routine follow up.  She reports no pain, no contractions, no discharge, baby is kicking often.  See flow sheet for details.  A/P: Pregnancy at [redacted]w[redacted]d.  Doing well.   Pregnancy issues include none at this time. Some wrist pain in mornings. This resolves as the day progresses  Infant feeding choice breastfeeding Contraception choice Mirena IUD Infant circumcision desired no GBS/GC/CZ results were reviewed today.   Labor precautions reviewed. Kick counts reviewed.  F/u in 1 week.

## 2015-04-22 NOTE — Patient Instructions (Signed)
Tercer trimestre del embarazo  (Third Trimester of Pregnancy)  El tercer trimestre del embarazo abarca desde la semana 29 hasta la semana 42, desde el 7 mes hasta el 9. En este trimestre el beb (feto) se desarrolla muy rpidamente. Hacia el final del noveno mes, el beb que an no ha nacido mide alrededor de 20 pulgadas (45 cm) de largo. Y pesa entre 6 y 10 libras (2,700 y 4,500 kg).  CUIDADOS EN EL HOGAR   Evite fumar, consumir hierbas y beber alcohol. Evite los frmacos que no apruebe el mdico.  Slo tome los medicamentos que le haya indicado su mdico. Algunos medicamentos son seguros para tomar durante el embarazo y otros no lo son.  Haga ejercicios slo como le indique el mdico. Deje de hacer ejercicios si comienza a tener clicos.  Haga comidas regulares y sanas.  Use un sostn que le brinde buen soporte si sus mamas estn sensibles.  No utilice la baera con agua caliente, baos turcos y saunas.  Colquese el cinturn de seguridad cuando conduzca.  Evite comer carne cruda y el contacto con los utensilios y desperdicios de los gatos.  Tome las vitaminas indicadas para la etapa prenatal.  Trate de tomar medicamentos para mover el intestino (laxantes) segn lo necesario y si su mdico la autoriza. Consuma ms fibra comiendo frutas y vegetales frescos y granos enteros. Beba gran cantidad de lquido para mantener el pis (orina) de tono claro o amarillo plido.  Tome baos de agua tibia (baos de asiento) para calmar el dolor o las molestias causadas por las hemorroides. Use una crema para las hemorroides si el mdico la autoriza.  Si tiene venas hinchadas y abultadas (venas varicosas), use medias de soporte. Eleve (levante) los pies durante 15 minutos, 3 o 4 veces por da. Limite el consumo de sal en su dieta.  Evite levantar objetos pesados, usar tacones altos y sintese derecha.  Descanse con las piernas elevadas si tiene calambres o dolor de cintura.  Visite a su dentista  si no lo ha hecho durante el embarazo. Use un cepillo de dientes blando para higienizarse los dientes. Use suavemente el hilo dental.  Puede tener sexo (relaciones sexuales) siempre que el mdico la autorice.  No viaje por largas distancias si puede evitarlo. Slo hgalo con la aprobacin de su mdico.  Haga el curso pre parto.  Practique conducir hasta el hospital.  Prepare el bolso que llevar.  Prepare la habitacin del beb.  Concurra a los controles mdicos. SOLICITE AYUDA SI:   No est segura si est en trabajo de parto o ha roto la bolsa de aguas.  Tiene mareos.  Siente clicos intensos o presin en la zona baja del vientre (abdomen).  Siente un dolor persistente en la zona del vientre.  Tiene malestar estomacal (nuseas), devuelve (vomita), o tiene deposiciones acuosas (diarrea).  Advierte un olor ftido que proviene de la vagina.  Siente dolor al hacer pis (orinar). SOLICITE AYUDA DE INMEDIATO SI:   Tiene fiebre.  Pierde lquido o sangre por la vagina.  Tiene sangrando o pequeas prdidas vaginales.  Siente dolor intenso o clicos en el abdomen.  Sube o baja de peso rpidamente.  Tiene dificultad para respirar o siente dolor en el pecho.  Sbitamente se le hinchan el rostro, las manos, los tobillos, los pies o las piernas.  No ha sentido los movimientos del beb durante una hora.  Siente un dolor de cabeza intenso que no se alivia con medicamentos.  Su visin se modifica. Document   Released: 06/17/2013 ExitCare Patient Information 2015 [employer]. This information is not intended to replace advice given to you by your health care provider. Make sure you discuss any questions you have with your health care provider.  

## 2015-04-29 ENCOUNTER — Ambulatory Visit (INDEPENDENT_AMBULATORY_CARE_PROVIDER_SITE_OTHER): Payer: Self-pay | Admitting: Family Medicine

## 2015-04-29 VITALS — BP 114/68 | HR 60 | Temp 98.4°F | Wt 131.0 lb

## 2015-04-29 DIAGNOSIS — Z3483 Encounter for supervision of other normal pregnancy, third trimester: Secondary | ICD-10-CM

## 2015-04-29 NOTE — Patient Instructions (Signed)
Tercer trimestre del embarazo  (Third Trimester of Pregnancy)  El tercer trimestre del embarazo abarca desde la semana 29 hasta la semana 42, desde el 7 mes hasta el 9. En este trimestre el beb (feto) se desarrolla muy rpidamente. Hacia el final del noveno mes, el beb que an no ha nacido mide alrededor de 20 pulgadas (45 cm) de largo. Y pesa entre 6 y 10 libras (2,700 y 4,500 kg).  CUIDADOS EN EL HOGAR   Evite fumar, consumir hierbas y beber alcohol. Evite los frmacos que no apruebe el mdico.  Slo tome los medicamentos que le haya indicado su mdico. Algunos medicamentos son seguros para tomar durante el embarazo y otros no lo son.  Haga ejercicios slo como le indique el mdico. Deje de hacer ejercicios si comienza a tener clicos.  Haga comidas regulares y sanas.  Use un sostn que le brinde buen soporte si sus mamas estn sensibles.  No utilice la baera con agua caliente, baos turcos y saunas.  Colquese el cinturn de seguridad cuando conduzca.  Evite comer carne cruda y el contacto con los utensilios y desperdicios de los gatos.  Tome las vitaminas indicadas para la etapa prenatal.  Trate de tomar medicamentos para mover el intestino (laxantes) segn lo necesario y si su mdico la autoriza. Consuma ms fibra comiendo frutas y vegetales frescos y granos enteros. Beba gran cantidad de lquido para mantener el pis (orina) de tono claro o amarillo plido.  Tome baos de agua tibia (baos de asiento) para calmar el dolor o las molestias causadas por las hemorroides. Use una crema para las hemorroides si el mdico la autoriza.  Si tiene venas hinchadas y abultadas (venas varicosas), use medias de soporte. Eleve (levante) los pies durante 15 minutos, 3 o 4 veces por da. Limite el consumo de sal en su dieta.  Evite levantar objetos pesados, usar tacones altos y sintese derecha.  Descanse con las piernas elevadas si tiene calambres o dolor de cintura.  Visite a su dentista  si no lo ha hecho durante el embarazo. Use un cepillo de dientes blando para higienizarse los dientes. Use suavemente el hilo dental.  Puede tener sexo (relaciones sexuales) siempre que el mdico la autorice.  No viaje por largas distancias si puede evitarlo. Slo hgalo con la aprobacin de su mdico.  Haga el curso pre parto.  Practique conducir hasta el hospital.  Prepare el bolso que llevar.  Prepare la habitacin del beb.  Concurra a los controles mdicos. SOLICITE AYUDA SI:   No est segura si est en trabajo de parto o ha roto la bolsa de aguas.  Tiene mareos.  Siente clicos intensos o presin en la zona baja del vientre (abdomen).  Siente un dolor persistente en la zona del vientre.  Tiene malestar estomacal (nuseas), devuelve (vomita), o tiene deposiciones acuosas (diarrea).  Advierte un olor ftido que proviene de la vagina.  Siente dolor al hacer pis (orinar). SOLICITE AYUDA DE INMEDIATO SI:   Tiene fiebre.  Pierde lquido o sangre por la vagina.  Tiene sangrando o pequeas prdidas vaginales.  Siente dolor intenso o clicos en el abdomen.  Sube o baja de peso rpidamente.  Tiene dificultad para respirar o siente dolor en el pecho.  Sbitamente se le hinchan el rostro, las manos, los tobillos, los pies o las piernas.  No ha sentido los movimientos del beb durante una hora.  Siente un dolor de cabeza intenso que no se alivia con medicamentos.  Su visin se modifica. Document   Released: 06/17/2013 ExitCare Patient Information 2015 [employer]. This information is not intended to replace advice given to you by your health care provider. Make sure you discuss any questions you have with your health care provider.  

## 2015-04-29 NOTE — Progress Notes (Signed)
[name] Freeman is a 24 y.o. G2P1001 at 3910w5d for routine follow up.  She reports some back pain and some swelling and discomfort in her hands. No bleeding, slight discharge (clear/mucus).  See flow sheet for details.  A/P: Pregnancy at 3810w5d.  Doing well.   Pregnancy issues include some aches and pains, otherwise unremarkable  Infant feeding choice Breastfeeding Contraception choice Mirena IUD Infant circumcision desired no GBS/GC/CZ results were reviewed today.   Labor precautions reviewed. Kick counts reviewed.  Kathee DeltonIan D McKeag, MD,MS,  PGY2 04/29/2015 4:12 PM

## 2015-05-03 ENCOUNTER — Telehealth: Payer: Self-pay | Admitting: Family Medicine

## 2015-05-03 DIAGNOSIS — O480 Post-term pregnancy: Secondary | ICD-10-CM

## 2015-05-03 DIAGNOSIS — Z3403 Encounter for supervision of normal first pregnancy, third trimester: Secondary | ICD-10-CM

## 2015-05-03 NOTE — Telephone Encounter (Addendum)
Patient has been scheduled for post-dates (>40 weeks) US with AFI and NST (US OB limited) at [facility], on Friday 7/8 at 0930. Discussed with MFM clinic, scheduled and future order placed in Epic. Will call patient to notify her of this upcoming appointment.  Called [employer] Line for [name] to call patient, interpreter Shiela MayerJesus Javier ID# [phone]. Unable to reach patient, however [name] left a voicemail on patient's primary contact # [phone] (M), notifying her of the newly scheduled upcoming US testing at [facility] as above.  Saralyn PilarAlexander Karamalegos, DO [facility], PGY-3

## 2015-05-04 ENCOUNTER — Inpatient Hospital Stay (HOSPITAL_COMMUNITY)
Admission: AD | Admit: 2015-05-04 | Discharge: 2015-05-06 | DRG: 774 | Disposition: A | Discharge status: Home / Self Care | Payer: Medicaid Other | Source: Ambulatory Visit | Attending: Obstetrics & Gynecology | Admitting: Obstetrics & Gynecology

## 2015-05-04 ENCOUNTER — Encounter (HOSPITAL_COMMUNITY): Payer: Self-pay | Admitting: *Deleted

## 2015-05-04 ENCOUNTER — Inpatient Hospital Stay (HOSPITAL_COMMUNITY): Payer: Medicaid Other | Admitting: Anesthesiology

## 2015-05-04 DIAGNOSIS — D509 Iron deficiency anemia, unspecified: Secondary | ICD-10-CM | POA: Diagnosis present

## 2015-05-04 DIAGNOSIS — IMO0001 Reserved for inherently not codable concepts without codable children: Secondary | ICD-10-CM

## 2015-05-04 DIAGNOSIS — O9902 Anemia complicating childbirth: Secondary | ICD-10-CM

## 2015-05-04 DIAGNOSIS — R200 Anesthesia of skin: Secondary | ICD-10-CM | POA: Diagnosis not present

## 2015-05-04 DIAGNOSIS — O99824 Streptococcus B carrier state complicating childbirth: Secondary | ICD-10-CM | POA: Diagnosis present

## 2015-05-04 DIAGNOSIS — O480 Post-term pregnancy: Principal | ICD-10-CM | POA: Diagnosis present

## 2015-05-04 DIAGNOSIS — O864 Pyrexia of unknown origin following delivery: Secondary | ICD-10-CM

## 2015-05-04 DIAGNOSIS — Z3A40 40 weeks gestation of pregnancy: Secondary | ICD-10-CM | POA: Diagnosis present

## 2015-05-04 LAB — CBC
HCT: 35 % — ABNORMAL LOW (ref 36.0–46.0)
Hemoglobin: 11.4 g/dL — ABNORMAL LOW (ref 12.0–15.0)
MCH: 26 pg (ref 26.0–34.0)
MCHC: 32.6 g/dL (ref 30.0–36.0)
MCV: 79.7 fL (ref 78.0–100.0)
PLATELETS: 207 10*3/uL (ref 150–400)
RBC: 4.39 MIL/uL (ref 3.87–5.11)
RDW: 17.8 % — ABNORMAL HIGH (ref 11.5–15.5)
WBC: 7.3 10*3/uL (ref 4.0–10.5)

## 2015-05-04 LAB — TYPE AND SCREEN
ABO/RH(D): B POS
ANTIBODY SCREEN: NEGATIVE

## 2015-05-04 MED ORDER — LACTATED RINGERS IV SOLN
INTRAVENOUS | Status: DC
  Administered 2015-05-04 (×2): via INTRAVENOUS

## 2015-05-04 MED ORDER — PHENYLEPHRINE 40 MCG/ML (10ML) SYRINGE FOR IV PUSH (FOR BLOOD PRESSURE SUPPORT)
80.0000 ug | PREFILLED_SYRINGE | INTRAVENOUS | Status: DC | PRN
  Filled 2015-05-04: qty 2
  Filled 2015-05-04: qty 20

## 2015-05-04 MED ORDER — PENICILLIN G POTASSIUM 5000000 UNITS IJ SOLR
2.5000 10*6.[IU] | INTRAVENOUS | Status: DC
  Administered 2015-05-04 (×2): 2.5 10*6.[IU] via INTRAVENOUS
  Filled 2015-05-04 (×6): qty 2.5

## 2015-05-04 MED ORDER — CITRIC ACID-SODIUM CITRATE 334-500 MG/5ML PO SOLN: 30.0000 mL | ORAL | Status: DC | PRN

## 2015-05-04 MED ORDER — LACTATED RINGERS IV SOLN
500.0000 mL | INTRAVENOUS | Status: DC | PRN
  Administered 2015-05-04 (×2): 500 mL via INTRAVENOUS

## 2015-05-04 MED ORDER — LIDOCAINE HCL (PF) 1 % IJ SOLN
INTRAMUSCULAR | Status: DC | PRN
  Administered 2015-05-04 (×2): 4 mL

## 2015-05-04 MED ORDER — PENICILLIN G POTASSIUM 5000000 UNITS IJ SOLR
5.0000 10*6.[IU] | Freq: Once | INTRAVENOUS | Status: AC
  Administered 2015-05-04: 5 10*6.[IU] via INTRAVENOUS
  Filled 2015-05-04: qty 5

## 2015-05-04 MED ORDER — OXYCODONE-ACETAMINOPHEN 5-325 MG PO TABS: 1.0000 | ORAL_TABLET | ORAL | Status: DC | PRN

## 2015-05-04 MED ORDER — DIPHENHYDRAMINE HCL 50 MG/ML IJ SOLN: 12.5000 mg | INTRAMUSCULAR | Status: DC | PRN

## 2015-05-04 MED ORDER — OXYTOCIN BOLUS FROM INFUSION: 500.0000 mL | INTRAVENOUS | Status: DC

## 2015-05-04 MED ORDER — ACETAMINOPHEN 325 MG PO TABS
650.0000 mg | ORAL_TABLET | ORAL | Status: DC | PRN
  Administered 2015-05-05: 650 mg via ORAL
  Filled 2015-05-04: qty 2

## 2015-05-04 MED ORDER — FENTANYL CITRATE (PF) 100 MCG/2ML IJ SOLN
100.0000 ug | INTRAMUSCULAR | Status: DC | PRN
  Administered 2015-05-04 (×2): 100 ug via INTRAVENOUS
  Filled 2015-05-04 (×2): qty 2

## 2015-05-04 MED ORDER — EPHEDRINE 5 MG/ML INJ
10.0000 mg | INTRAVENOUS | Status: DC | PRN
  Filled 2015-05-04: qty 2

## 2015-05-04 MED ORDER — FENTANYL 2.5 MCG/ML BUPIVACAINE 1/10 % EPIDURAL INFUSION (WH - ANES)
14.0000 mL/h | INTRAMUSCULAR | Status: DC | PRN
  Administered 2015-05-04 (×2): 14 mL/h via EPIDURAL
  Filled 2015-05-04: qty 125

## 2015-05-04 MED ORDER — OXYCODONE-ACETAMINOPHEN 5-325 MG PO TABS: 2.0000 | ORAL_TABLET | ORAL | Status: DC | PRN

## 2015-05-04 MED ORDER — ONDANSETRON HCL 4 MG/2ML IJ SOLN
4.0000 mg | Freq: Four times a day (QID) | INTRAMUSCULAR | Status: DC | PRN
  Administered 2015-05-05: 4 mg via INTRAVENOUS
  Filled 2015-05-04: qty 2

## 2015-05-04 MED ORDER — LIDOCAINE HCL (PF) 1 % IJ SOLN
30.0000 mL | INTRAMUSCULAR | Status: DC | PRN
  Filled 2015-05-04: qty 30

## 2015-05-04 MED ORDER — SODIUM CHLORIDE 0.9 % IV SOLN
2.0000 g | Freq: Once | INTRAVENOUS | Status: DC
  Filled 2015-05-04: qty 2000

## 2015-05-04 MED ORDER — OXYTOCIN 40 UNITS IN LACTATED RINGERS INFUSION - SIMPLE MED
62.5000 mL/h | INTRAVENOUS | Status: DC
  Administered 2015-05-04: 62.5 mL/h via INTRAVENOUS
  Filled 2015-05-04: qty 1000

## 2015-05-04 NOTE — H&P (Signed)
LABOR ADMISSION HISTORY AND PHYSICAL  Susan Freeman is a 24 y.o. female G2P1001 with IUP at [redacted]w[redacted]d by [redacted]w[redacted]d dating sono presenting in active labor. She reports +FMs, No LOF, no VB, no blurry vision, headaches or peripheral edema, and RUQ pain.  She plans on breast feeding. She request mirena for birth control.  Dating: By [redacted]w[redacted]d not consistent with LMP, off by [redacted]w[redacted]d --->  Estimated Date of Delivery: 05/01/15  Prenatal History/Complications:  Past Medical History: No past medical history on file.  Past Surgical History: No past surgical history on file.  Obstetrical History: OB History    Gravida Para Term Preterm AB TAB SAB Ectopic Multiple Living   Social History: History   Social History  . Marital Status: Significant Other    Spouse Name: N/A  . Number of Children: N/A  . Years of Education: N/A   Social History Main Topics  . Smoking status: Never Smoker   . Smokeless tobacco: Not on file  . Alcohol Use: No  . Drug Use: No  . Sexual Activity: No   Other Topics Concern  . Not on file   Social History Narrative    Family History: No family history on file.  Allergies: No Known Allergies  Prescriptions prior to admission  Medication Sig Dispense Refill Last Dose  . Prenatal Vit-Fe Fumarate-FA (PRENATAL MULTIVITAMIN) TABS tablet Take 1 tablet by mouth daily at 12 noon.   05/03/2015 at Unknown time     Review of Systems   All systems reviewed and negative except as stated in HPI  Blood pressure 131/80, pulse 71, temperature 98.4 F (36.9 C), temperature source Oral, resp. rate 18, height  (1.499 m), weight 131 lb (59.421 kg), last menstrual period 07/07/2014, unknown if currently breastfeeding. General appearance: alert, cooperative and appears stated age Lungs: clear to auscultation bilaterally Heart: regular rate and rhythm Abdomen: soft, non-tender; bowel sounds normal Extremities: Homans sign is negative, no sign of  DVT  Dilation: 6 Effacement (%): 60 Station: -3 Exam by:: Judie Petit RN    Prenatal labs: ABO, Rh: B/POS/-- (01/11 1327) Antibody: NEG (01/11 1327) Rubella:   RPR: NON REAC (04/15 1605)  HBsAg: NEGATIVE (01/11 1327)  HIV: NONREACTIVE (04/15 1605)  GBS: Detected (06/10 1627)  1 hr Glucola 111 Genetic screening  Too late Anatomy US normal  Prenatal Transfer Tool  Maternal Diabetes: No Genetic Screening: Declined Maternal Ultrasounds/Referrals: Normal Fetal Ultrasounds or other Referrals:  None Maternal Substance Abuse:  No Significant Maternal Medications:  None Significant Maternal Lab Results: Lab values include: Group B Strep positive   Clinic  [facility] - Dr. Althea Charon (pager [phone], cell: [phone])  Dating LMP/Ultrasound: [redacted]w[redacted]d       Ultrasound consistent with LMP: Yes  Genetic Screen 1 Screen:     Too late       AFP:       n/a          Quad:  n/a               NIPS: n/a  Anatomic [place]  at [redacted]w[redacted]d, EFW 48%, normal anatomy, normal fluid, anterior placenta no previa  GTT Early:   n/a          Third trimester: 1hr GTT - passed, 111 (late draw, 1 hr 10 min)  TDaP vaccine  02/11/15  Flu vaccine  12/16/14  GBS  Positive  Baby Food  breastfeeding  Contraception  Mirena IUD  Circumcision  No  Pediatrician  [facility]    No results found for this or any previous visit (from the past 24 hour(s)).  Patient Active Problem List   Diagnosis Date Noted  . Maternal iron deficiency anemia affecting pregnancy in third trimester, antepartum 03/07/2015  . Right low back pain 02/11/2015  . Encounter for supervision of other normal pregnancy in second trimester   . Evaluate anatomy not seen on prior sonogram   . [redacted] weeks gestation of pregnancy   . Encounter for fetal anatomic survey   . [redacted] weeks gestation of pregnancy   . Supervision of other normal pregnancy 10/14/2014  . Pain of right breast 01/09/2014    Assessment: Susan Freeman is a 24 y.o. G2P1001 at 5668w3d here for labor  check dilated to 6cm  #Labor:expectant mgmt, will contact Dr. Kirtland BouchardK for continuity delivery #Pain: Epidural upon request #FWB: Cat I #ID:  GBS pos: amp #MOF: breast #MOC:mirena   ACOSTA,KRISTY ROCIO 05/04/2015, 2:06 PM

## 2015-05-04 NOTE — Progress Notes (Addendum)
OB LABOR PROGRESS NOTE  Subjective: Comfortable following epidural about 1 hour ago, seems to have good effect. Increased spontaneous leakage of fluids reported.  Objective: BP 104/63 mmHg  Pulse 61  Temp(Src) 97.8 F (36.6 C) (Oral)  Resp 18  Ht 4\' 11"  (1.499 m)  Wt 59.421 kg (131 lb)  BMI 26.44 kg/m2  SpO2 100%  LMP 07/07/2014 (Approximate)      FHT:  FHR: 130-135 bpm, variability: moderate,  accelerations:  Present 15x15,  decelerations:  Absent UC:   regular, every 3-5 minutes  SVE:   Dilation: 8 Effacement (%): 100 Station: -2 Exam by:: Shardai Star   SROM @ 1836, clear fluids, with initial small forebag still in place. On current check, further leakage without appreciable bag.  Labs: Lab Results  Component Value Date   WBC 7.3 05/04/2015   HGB 11.4* 05/04/2015   HCT 35.0* 05/04/2015   MCV 79.7 05/04/2015   PLT 207 05/04/2015    Assessment / Plan: Susan Freeman is a 24 y.o. G2P1001 at 5739w3d by LMP. Admitted with SOL, in active labor, progressing well.  Labor: Expectant management. Progressing normally. SROM @ 1836, further leakage @ 1900 with resolved forebag, no AROM needed. Ctx increased, comfortable s/p epidural. Preeclampsia:  n/a Fetal Wellbeing:  Category I Pain Control:  Epidural I/D:  GBS positive. Completed >4 hours PCN prophylaxis Anticipated MOD:  NSVD   Note - patient will be a continuity delivery, and I will also plan to take of baby, following delivery will admit to nursery under FPTS Dr. Deirdre Priesthambliss. - Br feeding, Mirena IUD, Boy, no circ  Susan PilarAlexander Habeeb Puertas, DO [facility], PGY-3 05/04/2015, 7:01 PM

## 2015-05-04 NOTE — Anesthesia Procedure Notes (Signed)
Epidural Patient location during procedure: OB  Staffing Anesthesiologist: Juliauna Stueve Performed by: anesthesiologist   Preanesthetic Checklist Completed: patient identified, site marked, surgical consent, pre-op evaluation, timeout performed, IV checked, risks and benefits discussed and monitors and equipment checked  Epidural Patient position: sitting Prep: site prepped and draped and DuraPrep Patient monitoring: continuous pulse ox and blood pressure Approach: midline Location: L3-L4 Injection technique: LOR saline  Needle:  Needle type: Tuohy  Needle gauge: 17 G Needle length: 9 cm and 9 Needle insertion depth: 6 cm Catheter type: closed end flexible Catheter size: 19 Gauge Catheter at skin depth: 10 cm Test dose: negative  Assessment Events: blood not aspirated, injection not painful, no injection resistance, negative IV test and no paresthesia

## 2015-05-04 NOTE — Anesthesia Preprocedure Evaluation (Signed)
Anesthesia Evaluation  Patient identified by MRN, date of birth, ID band Patient awake    Reviewed: Allergy & Precautions, NPO status , Patient's Chart, lab work & pertinent test results  History of Anesthesia Complications Negative for: history of anesthetic complications  Airway Mallampati: II  TM Distance: >3 FB Neck ROM: Full    Dental no notable dental hx. (+) Dental Advisory Given   Pulmonary neg pulmonary ROS,  breath sounds clear to auscultation  Pulmonary exam normal       Cardiovascular negative cardio ROS Normal cardiovascular examRhythm:Regular Rate:Normal     Neuro/Psych negative neurological ROS  negative psych ROS   GI/Hepatic negative GI ROS, Neg liver ROS,   Endo/Other  negative endocrine ROS  Renal/GU negative Renal ROS  negative genitourinary   Musculoskeletal negative musculoskeletal ROS (+)   Abdominal   Peds negative pediatric ROS (+)  Hematology  (+) anemia ,   Anesthesia Other Findings   Reproductive/Obstetrics (+) Pregnancy                             Anesthesia Physical Anesthesia Plan  ASA: II  Anesthesia Plan: Epidural   Post-op Pain Management:    Induction:   Airway Management Planned:   Additional Equipment:   Intra-op Plan:   Post-operative Plan:   Informed Consent: I have reviewed the patients History and Physical, chart, labs and discussed the procedure including the risks, benefits and alternatives for the proposed anesthesia with the patient or authorized representative who has indicated his/her understanding and acceptance.   Dental advisory given  Plan Discussed with: CRNA  Anesthesia Plan Comments:         Anesthesia Quick Evaluation

## 2015-05-04 NOTE — Progress Notes (Signed)
OB LABOR PROGRESS NOTE  History provided by patient in Spanish with assistance of Spanish Language Interpreter present during exam.  Subjective: Reports still feeling regular contractions, less painful than earlier, did receive Fentanyl. Now feeling more back pain.  - Admits to some leakage of fluids earlier, but unsure if ROM  Considering epidural, discussed this option in more detail, and patient wishes to proceed with epidural at this time.  Objective: BP 112/73 mmHg  Pulse 65  Temp(Src) 97.8 F (36.6 C) (Oral)  Resp 18  Ht 4\' 11"  (1.499 m)  Wt 59.421 kg (131 lb)  BMI 26.44 kg/m2  LMP 07/07/2014 (Approximate)      FHT:  FHR: 125 bpm, variability: moderate,  accelerations:  Present,  decelerations:  Absent UC:   regular, every 6-7 minutes  SVE:   Dilation: 6.5 Effacement (%): 100 Station: -3 Exam by:: A. Sowder   Mem: bulging bag fluid, intact membranes.  Labs: Lab Results  Component Value Date   WBC 7.3 05/04/2015   HGB 11.4* 05/04/2015   HCT 35.0* 05/04/2015   MCV 79.7 05/04/2015   PLT 207 05/04/2015    Assessment / Plan: [name] Auld is a 24 y.o. G2P1001 at 4130w3d by LMP. Admitted with SOL, in active labor, progressing well.  Labor: Progressing normally, ctx had decreased some, still regular. Remains with intact membranes, bulging bag appreciable with significantly thinned out cervix at nearly 100%. Proceed with epidural now. Re-evaluate within 1 hour to consider AROM. Preeclampsia:  n/a Fetal Wellbeing:  Category I Pain Control:  Fentanyl IV PRN, proceed to Epidural now I/D:  GBS positive. Started PCN prophylaxis @ 1254 >> goal 4 hours abx by 1654 Anticipated MOD:  NSVD   Saralyn PilarAlexander Michelene Keniston, DO [facility], PGY-3 05/04/2015, 5:45 PM

## 2015-05-04 NOTE — Progress Notes (Signed)
OB LABOR PROGRESS NOTE  Subjective: Increasing pressure and discomfort now. Tried position changes. More spontaneous leakage of fluid. Required IV bolus due to baby decels.  Objective: BP 114/77 mmHg  Pulse 70  Temp(Src) 98 F (36.7 C) (Oral)  Resp 18  Ht 4\' 11"  (1.499 m)  Wt 59.421 kg (131 lb)  BMI 26.44 kg/m2  SpO2 81%  LMP 07/07/2014 (Approximate)   Total I/O In: -  Out: 200 [Urine:200]  FHT:  FHR: 135 bpm, variability: moderate,  accelerations:  Present 15x15,  decelerations:  Variable, now Early UC:   regular, every 2-4 minutes  SVE:   Dilation: 9 Effacement (%): 100 Station: 0 Exam by:: Susan Bosworthanya Willis, RN    SROM @ [phone] - Resolved floor bag spontaneously ruptured 2109  Labs: Lab Results  Component Value Date   WBC 7.3 05/04/2015   HGB 11.4* 05/04/2015   HCT 35.0* 05/04/2015   MCV 79.7 05/04/2015   PLT 207 05/04/2015    Assessment / Plan: Susan Freeman is a 24 y.o. G2P1001 at 7614w3d by LMP. Admitted with SOL, in active labor, progressing well.  Labor: Expectant management. Progressing normally, not quite complete dilation, some variables now early decels, possible OP positioning. Continue position changes, L-lateral sims, IV bolus, s/p foley. Preeclampsia:  n/a Fetal Wellbeing:  Category I Pain Control:  Epidural I/D:  GBS positive. Completed >4 hours PCN prophylaxis Anticipated MOD:  NSVD   Note - patient will be a continuity delivery, and I will also plan to take of baby, following delivery will admit to nursery under FPTS Dr. Deirdre Priesthambliss. - Br feeding, Mirena IUD, Boy, no circ  Saralyn PilarAlexander Karamalegos, DO [facility], PGY-3 05/04/2015, 9:59 PM

## 2015-05-04 NOTE — MAU Note (Signed)
Pain in abd, started last night. Every 5 mn.  Scant bloody mucous. No leaking. 2nd bby, prior vag. No complication.s

## 2015-05-04 NOTE — Progress Notes (Signed)
I stopped to ck on patients need , I ordered some snacks, I assisted Smitty CordsAlexander J. Karamalegos DO with some explanation of care. Also I was present during Epidural with  Dr Gentry RochJudd, by Orlan LeavensViria Alvarez [name].

## 2015-05-05 ENCOUNTER — Encounter (HOSPITAL_COMMUNITY): Payer: Self-pay

## 2015-05-05 LAB — CBC
HCT: 33.9 % — ABNORMAL LOW (ref 36.0–46.0)
HEMOGLOBIN: 11 g/dL — AB (ref 12.0–15.0)
MCH: 26 pg (ref 26.0–34.0)
MCHC: 32.4 g/dL (ref 30.0–36.0)
MCV: 80.1 fL (ref 78.0–100.0)
PLATELETS: 206 10*3/uL (ref 150–400)
RBC: 4.23 MIL/uL (ref 3.87–5.11)
RDW: 17.8 % — ABNORMAL HIGH (ref 11.5–15.5)
WBC: 12.9 10*3/uL — AB (ref 4.0–10.5)

## 2015-05-05 LAB — HIV ANTIBODY (ROUTINE TESTING W REFLEX): HIV Screen 4th Generation wRfx: NONREACTIVE

## 2015-05-05 LAB — RPR: RPR Ser Ql: NONREACTIVE

## 2015-05-05 MED ORDER — DIBUCAINE 1 % RE OINT: 1.0000 "application " | TOPICAL_OINTMENT | RECTAL | Status: DC | PRN

## 2015-05-05 MED ORDER — OXYCODONE-ACETAMINOPHEN 5-325 MG PO TABS: 2.0000 | ORAL_TABLET | ORAL | Status: DC | PRN

## 2015-05-05 MED ORDER — MISOPROSTOL 200 MCG PO TABS
ORAL_TABLET | ORAL | Status: AC
  Administered 2015-05-05: 1000 ug via ORAL
  Filled 2015-05-05: qty 5

## 2015-05-05 MED ORDER — BENZOCAINE-MENTHOL 20-0.5 % EX AERO
1.0000 "application " | INHALATION_SPRAY | CUTANEOUS | Status: DC | PRN
  Filled 2015-05-05: qty 56

## 2015-05-05 MED ORDER — ONDANSETRON HCL 4 MG/2ML IJ SOLN: 4.0000 mg | INTRAMUSCULAR | Status: DC | PRN

## 2015-05-05 MED ORDER — MEPERIDINE HCL 25 MG/ML IJ SOLN
12.5000 mg | Freq: Once | INTRAMUSCULAR | Status: DC
  Filled 2015-05-05: qty 1

## 2015-05-05 MED ORDER — DIPHENHYDRAMINE HCL 25 MG PO CAPS: 25.0000 mg | ORAL_CAPSULE | Freq: Four times a day (QID) | ORAL | Status: DC | PRN

## 2015-05-05 MED ORDER — TETANUS-DIPHTH-ACELL PERTUSSIS 5-2.5-18.5 LF-MCG/0.5 IM SUSP: 0.5000 mL | Freq: Once | INTRAMUSCULAR | Status: DC

## 2015-05-05 MED ORDER — PRENATAL MULTIVITAMIN CH
1.0000 | ORAL_TABLET | Freq: Every day | ORAL | Status: DC
  Administered 2015-05-05 – 2015-05-06 (×2): 1 via ORAL
  Filled 2015-05-05 (×2): qty 1

## 2015-05-05 MED ORDER — LANOLIN HYDROUS EX OINT: TOPICAL_OINTMENT | CUTANEOUS | Status: DC | PRN

## 2015-05-05 MED ORDER — ACETAMINOPHEN 325 MG PO TABS: 650.0000 mg | ORAL_TABLET | ORAL | Status: DC | PRN

## 2015-05-05 MED ORDER — ZOLPIDEM TARTRATE 5 MG PO TABS: 5.0000 mg | ORAL_TABLET | Freq: Every evening | ORAL | Status: DC | PRN

## 2015-05-05 MED ORDER — SENNOSIDES-DOCUSATE SODIUM 8.6-50 MG PO TABS
2.0000 | ORAL_TABLET | ORAL | Status: DC
  Administered 2015-05-05: 2 via ORAL
  Filled 2015-05-05: qty 2

## 2015-05-05 MED ORDER — MISOPROSTOL 200 MCG PO TABS
1000.0000 ug | ORAL_TABLET | Freq: Once | ORAL | Status: AC
  Administered 2015-05-05: 1000 ug via ORAL

## 2015-05-05 MED ORDER — ONDANSETRON HCL 4 MG PO TABS: 4.0000 mg | ORAL_TABLET | ORAL | Status: DC | PRN

## 2015-05-05 MED ORDER — SIMETHICONE 80 MG PO CHEW: 80.0000 mg | CHEWABLE_TABLET | ORAL | Status: DC | PRN

## 2015-05-05 MED ORDER — IBUPROFEN 600 MG PO TABS
600.0000 mg | ORAL_TABLET | Freq: Four times a day (QID) | ORAL | Status: DC
  Administered 2015-05-05 – 2015-05-06 (×6): 600 mg via ORAL
  Filled 2015-05-05 (×6): qty 1

## 2015-05-05 MED ORDER — OXYCODONE-ACETAMINOPHEN 5-325 MG PO TABS
1.0000 | ORAL_TABLET | ORAL | Status: DC | PRN
  Administered 2015-05-05 (×2): 1 via ORAL
  Filled 2015-05-05 (×2): qty 1

## 2015-05-05 MED ORDER — WITCH HAZEL-GLYCERIN EX PADS: 1.0000 "application " | MEDICATED_PAD | CUTANEOUS | Status: DC | PRN

## 2015-05-05 NOTE — Progress Notes (Signed)
I assisted RN with some questions and update about feding, by Orlan LeavensViria Alvarez [name]

## 2015-05-05 NOTE — Lactation Note (Signed)
This note was copied from the chart of Boy Kynzi Janes. Lactation Consultation Note  [name] present. P2, Ex BF for 1 year and 2 months. Mother states she knows how to hand express. Baby unlatched when LC in room.  Asleep at the breast.  Mother states baby breastfed for 10 min and fell asleep. Suggest feeding baby STS.  Reviewed supply demand, pacifier use, cluster feeding. Mom encouraged to feed baby 8-12 times/24 hours and with feeding cues.  Mom made aware of O/P services, breastfeeding support groups, community resources, and our phone # for post-discharge questions in Spanish.      Patient Name: Boy Carole CivilMarisela Mankin ZOXWR'UToday's Date: 05/05/2015 Reason for consult: Initial assessment   Maternal Data    Feeding Feeding Type: Breast Fed Length of feed: 10 min  LATCH Score/Interventions                      Lactation Tools Discussed/Used     Consult Status Consult Status: Follow-up Date: 05/06/15 Follow-up type: In-patient    Dahlia ByesBerkelhammer, Ruth [facility] 05/05/2015, 5:09 PM

## 2015-05-05 NOTE — Progress Notes (Signed)
I stopped to check on patients need I ordered her meals, I also assisted RN Ruth from Lactation, by Viria Alvarez [name] °

## 2015-05-05 NOTE — Progress Notes (Signed)
UR chart review completed.  

## 2015-05-05 NOTE — Progress Notes (Signed)
Post Partum Day 1  History provided by patient in Spanish with assistance of Spanish Language Interpreter (Eda) present during exam.  Subjective: voiding, tolerating PO and + flatus, abd cramping improved, vaginal bleeding slowed down (had persistent bleeding up to 500 additional cc blood loss after delivery, s/p cytotec 1000mg  PO with resolution). Able to ambulate to bathroom with nursing assistance. Still admits to some generalized weakness, and left leg "numbness". - Denies CP, SOB, dizziness/lightheadedness, edema  Objective: Blood pressure 96/48, pulse 70, temperature 99.6 F (37.6 C), temperature source Axillary, resp. rate 20, height 4\' 11"  (1.499 m), weight 131 lb (59.421 kg), last menstrual period 07/07/2014, SpO2 100 %, unknown if currently breastfeeding.  Physical Exam:  General: alert and cooperative, tired appearing Lochia: appropriate Uterine Fundus: firm, U-1 DVT Evaluation: No evidence of DVT seen on physical exam. Negative Homan's sign. No cords or calf tenderness. No significant calf/ankle edema. Lower ext - intact distal sensation to light touch bilaterally   Recent Labs  05/04/15 1400 05/05/15 0145  HGB 11.4* 11.0*  HCT 35.0* 33.9*    Assessment/Plan: Susan Freeman is a 24 y.o. G2P2002 at 3857w3d s/p NSVD healthy baby boy on 7/6 at 2310. Maternal GBS positive (recieved adequate >4 hr PCN prophylaxis)  Postpartum Fever (< 4 hr after delivery) - Resolved - Since resolved s/p Tylenol, had spiked fever up to 102.26F at 0135 - Monitor fever curve  Anemia, chronic during pregnancy / Postpartum vaginal bleeding (resolved) - S/p Cytotec 1000mg  PO 1 hr following delivery for continued slow vaginal bleeding - Hgb 11.4 to 11.0 with postpartum bleeding (resolved) - Re-check CBC 0500 7/8. If becomes symptomatic and/or further worsening bleeding, can re-check sooner today - Resume iron supplement on DC  S/p Epidural, Left leg numbness - gradually improving -  Continues to report Left leg numbness (>8 hours) s/p epidural (delivery @ 2310), able to ambulate with assistance, discussed with anesthesia. Plan to re-evaluate patient later today 7/7  Routine Postpartum, NSVD - Pain controlled, bowel regimen PRN - Continue breastfeeding, lactation consult as needed - Contraception: Mirena IUD - No circ for baby boy - Anticipate discharge tomorrow Fri 7/8   LOS: 1 day   Susan PilarAlexander Karamalegos, DO [facility], PGY-3 05/05/2015, 7:56 AM   I was present for the exam and agree with above.   Pt reports no sensation on anterior and lateral left thigh. Normal sensation posterior thigh and below the knee. Unable to bend left knee. Able to lift lower leg slightly off of bed.  Grossly normal sensation and mvmt of entire right leg. Will F/U w/ anesthesia after their eval. Pt reminded to call for assistance for all ambulation.   Susan Freeman, CNM 05/05/2015 11:43 AM

## 2015-05-06 ENCOUNTER — Ambulatory Visit (HOSPITAL_COMMUNITY): Payer: Self-pay

## 2015-05-06 ENCOUNTER — Inpatient Hospital Stay (HOSPITAL_COMMUNITY): Payer: Self-pay

## 2015-05-06 LAB — CBC
HCT: 26.9 % — ABNORMAL LOW (ref 36.0–46.0)
Hemoglobin: 8.8 g/dL — ABNORMAL LOW (ref 12.0–15.0)
MCH: 26.5 pg (ref 26.0–34.0)
MCHC: 32.7 g/dL (ref 30.0–36.0)
MCV: 81 fL (ref 78.0–100.0)
PLATELETS: 189 10*3/uL (ref 150–400)
RBC: 3.32 MIL/uL — ABNORMAL LOW (ref 3.87–5.11)
RDW: 18.5 % — ABNORMAL HIGH (ref 11.5–15.5)
WBC: 8.2 10*3/uL (ref 4.0–10.5)

## 2015-05-06 MED ORDER — OXYCODONE-ACETAMINOPHEN 5-325 MG PO TABS: 1.0000 | ORAL_TABLET | Freq: Four times a day (QID) | ORAL | Status: DC | PRN

## 2015-05-06 MED ORDER — FERROUS SULFATE 325 (65 FE) MG PO TABS: 325.0000 mg | ORAL_TABLET | Freq: Every day | ORAL | Status: DC

## 2015-05-06 MED ORDER — LANOLIN HYDROUS EX OINT: 1.0000 "application " | TOPICAL_OINTMENT | CUTANEOUS | Status: DC | PRN

## 2015-05-06 MED ORDER — DOCUSATE SODIUM 100 MG PO CAPS: 100.0000 mg | ORAL_CAPSULE | Freq: Two times a day (BID) | ORAL | Status: DC | PRN

## 2015-05-06 MED ORDER — IBUPROFEN 600 MG PO TABS: 600.0000 mg | ORAL_TABLET | Freq: Four times a day (QID) | ORAL | Status: DC

## 2015-05-06 NOTE — Discharge Instructions (Signed)

## 2015-05-06 NOTE — Lactation Note (Signed)
This note was copied from the chart of Susan Freeman. Lactation Consultation Note  Mother lying on her side breastfeeding baby.  Encouraged her to compress to keep baby active. Mother breastfed baby for 25 min. [name] arrived.  Mother denies problems or questions. Mom encouraged to feed baby 8-12 times/24 hours and with feeding cues.  Reviewed engorgement care and suggest she call if she needs further assistance.   Patient Name: Susan Carole CivilMarisela Usrey ZOXWR'UToday's Date: 05/06/2015 Reason for consult: Follow-up assessment   Maternal Data    Feeding Feeding Type: Breast Fed  [facility] Score/Interventions                      Lactation Tools Discussed/Used     Consult Status      Hardie PulleyBerkelhammer, Ruth Boschen 05/06/2015, 12:05 PM

## 2015-05-06 NOTE — Discharge Summary (Signed)
Obstetric Discharge Summary Reason for Admission: onset of labor, postdates Prenatal Procedures: NST and ultrasound Intrapartum Procedures: spontaneous vaginal delivery Postpartum Procedures: none Complications-Operative and Postpartum: none HEMOGLOBIN  Date Value Ref Range Status  05/06/2015 8.8* 12.0 - 15.0 g/dL Final   HCT  Date Value Ref Range Status  05/06/2015 26.9* 36.0 - 46.0 % Final    Discharge Diagnoses: Post-date pregnancy delivered NSVD  Hospital Course:  Susan Freeman is a 24 y.o. G2P2002 at 6066w3d who was admitted on 7/6 for SOL. Pregnancy course notable for mild iron deficiency anemia, otherwise uncomplicated. Maternal GBS positive, received adequate PCN prophylaxis (>4 hours). Progressed to vaginal delivery within 12 hours (see copied delivery note below). Postpartum course was uncomplicated, did have some initial residual prolonged numbness in Left LE from epidural but since resolved, tolerating PO and ambulation, pain controlled, bleeding improved, +flatus, and no barriers to discharge. Plan for continue breast feeding, contraception with Mirena IUD at next apt, follow-up in 4-6 weeks for postpartum visit.  Delivery Note At 11:10 PM a viable female was delivered via Vaginal, Spontaneous Delivery (Presentation: Left Occiput Anterior). APGAR: 9, 9; weight pending.  Placenta status: Intact, Spontaneous. Cord: 3 vessels with the following complications: None. Cord pH: n/a  Anesthesia: Epidural  Episiotomy: None Lacerations: None Suture Repair: None Est. Blood Loss (mL): 250cc  Mom to postpartum. Baby to Couplet care / Skin to Skin.  Upon arrival patient was complete and ready to push. She pushed for several contractions with good maternal effort to deliver a healthy baby boy. Delivery without difficulty, baby with good tone and placed on maternal abdomen for oral suctioning, drying and stimulation. Delayed cord clamping performed and cut by Mother. Placenta  delivered intact with 3V cord. Vaginal canal and perineum was inspected and intact. Pitocin was started and uterus massaged until bleeding slowed. Counts of sharps, instruments, and lap pads were all correct.  Susan PilarAlexander Karamalegos, DO [facility], PGY-3 05/04/2015, 11:47 PM  Patient is a G2P1001 at 6546w4d who was admitted w/ SOL, uncomplicated prenatal course. She progressed without augmentation. I was gloved and present for delivery in its entirety. Second stage of labor progressed to SVD Complications: none Lacerations: none EBL: 250 Freeman, Susan, CNM 2:19 AM   Physical Exam:  General: alert and cooperative, comfortable, NAD Lochia: appropriate Uterine Fundus: firm, U-2 DVT Evaluation: No evidence of DVT seen on physical exam. Negative Homan's sign. No cords or calf tenderness. No significant calf/ankle edema. Bilateral lower ext with normal sensation to light touch entirely, intact muscle str 5/5 hip, knee flex, ankle dorsiflex, DTR intact.  Discharge Information: Date: 05/06/2015 Activity: pelvic rest Diet: routine Medications: PNV, Ibuprofen, Colace, Iron and Percocet Baby feeding: plans to breastfeed Contraception: Mirena IUD Condition: stable Instructions: refer to practice specific booklet Discharge to: home Follow-up Information    Follow up with Susan PilarAlexander Karamalegos, DO In 6 weeks.   Specialty:  Osteopathic Medicine   Why:  Schedule 2 appointments, 1 for Postpartum follow-up and 1 for Mirena IUD insertion   Contact information:   [address] [place] [address] [phone]       Newborn Data: Live born female  Birth Weight: 8 lb 0.8 oz (3651 g) APGAR: 9, 9  Anticipated discharge home with mother.  Susan PilarAlexander Karamalegos, DO [facility], PGY-2 05/06/2015, 8:57 AM   I have seen and examined this patient and agree the above assessment. Susan Freeman,Susan Freeman 05/10/2015 1:32  PM

## 2015-05-06 NOTE — Anesthesia Postprocedure Evaluation (Signed)
  Anesthesia Post-op Note  Patient: Susan Freeman  Procedure(s) Performed: * No procedures listed *  Patient Location: Mother/Baby  Anesthesia Type:Epidural  Level of Consciousness: awake, alert , oriented and patient cooperative  Airway and Oxygen Therapy: Patient Spontanous Breathing  Post-op Pain: mild  Post-op Assessment: Patient's Cardiovascular Status Stable, Respiratory Function Stable, No headache, No backache and Patient able to bend at knees;states all numbness gone and able to stand without problem              Post-op Vital Signs: stable  Last Vitals:  Filed Vitals:   05/06/15 0607  BP: 116/64  Pulse: 62  Temp: 36.7 C  Resp: 16    Complications: No apparent anesthesia complications

## 2015-05-09 ENCOUNTER — Inpatient Hospital Stay (HOSPITAL_COMMUNITY): Admission: RE | Admit: 2015-05-09 | Payer: Self-pay | Source: Ambulatory Visit

## 2015-05-17 ENCOUNTER — Ambulatory Visit (INDEPENDENT_AMBULATORY_CARE_PROVIDER_SITE_OTHER): Payer: Medicaid Other | Admitting: Family Medicine

## 2015-05-17 ENCOUNTER — Encounter: Payer: Self-pay | Admitting: Family Medicine

## 2015-05-17 DIAGNOSIS — D509 Iron deficiency anemia, unspecified: Secondary | ICD-10-CM

## 2015-05-17 DIAGNOSIS — M549 Dorsalgia, unspecified: Secondary | ICD-10-CM

## 2015-05-17 DIAGNOSIS — Z0000 Encounter for general adult medical examination without abnormal findings: Secondary | ICD-10-CM

## 2015-05-17 MED ORDER — CYCLOBENZAPRINE HCL 10 MG PO TABS: 10.0000 mg | ORAL_TABLET | Freq: Three times a day (TID) | ORAL | Status: DC | PRN

## 2015-05-17 MED ORDER — IBUPROFEN 600 MG PO TABS: 600.0000 mg | ORAL_TABLET | Freq: Four times a day (QID) | ORAL | Status: DC

## 2015-05-17 NOTE — Assessment & Plan Note (Signed)
Continue ferrous sulfate  daily, tolerating - future repeat CBC 6-12 mo

## 2015-05-17 NOTE — Progress Notes (Signed)
   Subjective:    Patient ID: Susan CivilMarisela Shewell, female    DOB: 06/16/1991, 24 y.o.   MRN: [id number]  History provided in Spanish, with assistance of Interpreter Graciela.  HPI  POSTPARTUM FOLLOW-UP: - Presents today early about 2 weeks postpartum, s/p NSVD on 05/04/15 to healthy baby boy. No complications. Did receive epidural with some persistent LLE numbness, since resolved prior to discharge. See hospital discharge summary for full details. - Complains Right mid to lower back pain and some pelvic pain. Seems to be about the same since delivery with gradual improvement. Initially 5/10 seems for R-mid back pain, sore to touch, does some heavy lifting with baby equipment / carriage, bags and holding babies, but denies any specific injury or accident. - Taking Ibuprofen 600mg  q 6 hours with mild relief, stopped taking Percocet (was significantly helping after discharge) - Taking Iron supplement daily, without GI side effects - Breastfeeding frequently without complications. Using Lanolin ointment - PP Depression screen: Mood currently stable without depression or sadness. PHQ-9: score 0  - Contraception: Plan for Mirena IUD 6 wk postpartum, will need scholarship today - Admits mild bilateral hand numbness when wakes up then resolves - Denies vaginal bleeding (resolved thin lochia), HA, fevers/chills, edema, nausea / vomiting, numbness (resolved LLE), tingling, weakness, constipation  I have reviewed and updated the following as appropriate: allergies and current medications  Social Hx: - Never smoker  Review of Systems  See above HPI    Objective:   Physical Exam  BP 104/69 mmHg  Pulse 75  Temp(Src) 98.4 F (36.9 C) (Oral)  Ht 4' 10.5" (1.486 m)  Wt 110 lb 8 oz (50.122 kg)  BMI 22.70 kg/m2  LMP 07/07/2014 (Approximate)  Gen - well-appearing, comfortable and cooperative, NAD HEENT - MMM Neck - supple, non-tender, no thyromegaly Heart - RRR, no murmurs heard Lungs -  CTAB, no wheezing, crackles, or rhonchi. Normal work of breathing. Abd - soft, minimal bilateral lower pelvic pain to palpation without masses, otherwise non-tender nondistended, +active BS. Normal changes returning to pre-gravid abdomen, normal uterus without abnormality. MSK - Back: no deformity, non-tender over spinous processes, mild +TTP R-mid thoracic paraspinal muscles with mild hypertonicity and spasm, no lower back or SI pain. Seated SLR negative. Ext - non-tender, no edema, peripheral pulses intact +2 b/l Skin - warm, dry, no rashes Neuro - awake, alert, intact distal sensation to light touch, gait normal     Assessment & Plan:   See specific A&P problem list for details.

## 2015-05-17 NOTE — Patient Instructions (Addendum)
Estimado Susan Freeman, Gracias por venir a Estate manager/land agentla clnica hoy. Fue bueno verte!  1. En general, como sonidos que estn haciendo bien 2 semanas despus del [place]o. 2. Creo que su dolor de espalda se debe a un espasmo muscular, tener cuidado con la totalidad de su trabajo pesado, equipamiento para bebs y sus nios 3. Tomar Flexeril 10mg  - puede tomar la mitad o pestaa entera hasta 3 veces al da segn sea necesario - Recargados 600 mg de ibuprofeno - tomar cada 6 horas segn sea necesario para el dolor, sin embargo, no tome este derecho antes de Museum/gallery exhibitions officeramamantar. Puede ser en la Bartlettleche materna. - Si el ibuprofeno no ayuda, es posible interrumpir este, y Corporate investment bankerempezar Tylenol 500 a 1000 mg cada 6 horas segn sea necesario  Por favor, programar una cita de seguimiento posparto con el Dr. Althea CharonKaramalegos en 4 semanas para la insercin del DIU Mirena (VISITA PROCEDIMIENTO - NECESIDAD DE 30 MIN intervalo de tiempo)  Antes de programar ESTA VISITA - Es necesario para completar la solicitud y Hydrographic surveyorpresentar los siguientes documentos , llame a nuestro CLNICA y Media plannerllevar estas formas en , entonces es posible programar la visita . - Tambin se necesita uno de los siguientes: 1 ) Sus documentos de Dietitiandeclaracin de impuestos federales en el ltimo 1 a  O  2 ) Documentos de 4 semanas consecutivas de talones de pago dentro de los ltimos 6 meses  Si tiene cualquier otra pregunta o inquietud, por favor no dude en llamar a la clnica en contacto conmigo. Tambin puede programar una cita antes si es necesario.  Sin embargo, si sus Sport and exercise psychologistsntomas significativamente peor, por favor acuda a la sala de emergencia para buscar atencin mdica inmediata.  Susan PilarAlexander Karamalegos, DO [facility]  ------------  Dear Susan Freeman, Thank you for coming in to clinic today. It was good to see you!  1. Overall sounds like you are doing well 2 weeks after delivery. 2. I think that your Back Pain is due to muscle spasm, be careful with  all of your heavy lifting, baby equipment and your children 3. Take Flexeril 10mg  - may take half or whole tab up to 3 times daily as needed - Refilled Ibuprofen 600mg  - take every 6 hours as needed for pain, however do not take this RIGHT before breastfeeding. It may be in your breast milk. - If Ibuprofen not helping, you may discontinue this, and start Tylenol 500 to 1000mg  every 6 hours as needed  Please schedule a Postpartum follow-up appointment with Dr. Althea CharonKaramalegos in 4 weeks for Mirena IUD insertion (PROCEDURE VISIT - NEED 30 MIN TIME SLOT)  BEFORE YOU SCHEDULE THIS VISIT - You need to complete the Application AND bring the following documents, CALL OUR CLINIC AND BRING THESE FORMS IN, then you may schedule the visit. - ALSO you need one of the following: 1) Your federal tax return documents within the past 1 year  OR  2) Documents of 4 consecutive weeks of pay stubs within the past 6 months  If you have any other questions or concerns, please feel free to call the clinic to contact me. You may also schedule an earlier appointment if necessary.  However, if your symptoms get significantly worse, please go to the Emergency Department to seek immediate medical attention.  Susan PilarAlexander Karamalegos, DO [facility]

## 2015-05-17 NOTE — Assessment & Plan Note (Addendum)
Clinically consistent with R-mid thoracic paraspinal muscle spasm, 2 wk postpartum. Likely with heavy lifting (baby equipment, holding baby), +reproducible TTP, seems positional likely MSK, no associated symptoms. Improves with NSAIDs, previously with Oxycodone.  Plan: 1. Trial on Flexeril 5-10mg  TID PRN, cautioned on sedation, safe with breastfeeding 2. Refilled Ibuprofen  q 6 hr PRN, cautioned to take after breastfeeding or 2-3 hour prior to feeding, can be found in breastmilk, would try to taper down if not improving 3. Add / switch to Tylenol 500-1000mg  up to TID PRN 4. Start heating pad, moist heat, stretching 5. RTC 4 weeks follow-up

## 2015-05-17 NOTE — Progress Notes (Signed)
This appointment was canceled and switched to regular office visit postpartum follow-up. No prenatal encounter today 05/17/15. Patient s/p delivery on 05/04/15. This is an erroneous encounter.

## 2015-05-17 NOTE — Assessment & Plan Note (Signed)
2 wk postpartum, NSVD 7/6 - Doing well, breast feeding - R-mid back pain (see A&P), likely MSK - Some pelvic pain, improving - Continue ferrous sulfate daily, PNV - RTC 4 weeks for Mirena IUD (given paperwork today for Scholarship Application, once accepted return to clinic with paperwork, has financial assistance apt 7/28, then can schedule Mirena IUD procedure visit 30min)

## 2015-05-26 ENCOUNTER — Ambulatory Visit: Payer: Self-pay

## 2015-05-30 ENCOUNTER — Telehealth: Payer: Self-pay | Admitting: Family Medicine

## 2015-05-30 ENCOUNTER — Ambulatory Visit: Payer: Self-pay

## 2015-05-30 NOTE — Telephone Encounter (Signed)
Patient dropped Financial Assistance Form to be approved by PCP. Please, follow up with Patient.

## 2015-05-31 NOTE — Telephone Encounter (Signed)
Form is incomplete, need additional info from pt.  Will have [name] call pt and have her come back to complete. Susan Freeman, [name]

## 2015-06-14 ENCOUNTER — Encounter: Payer: Self-pay | Admitting: Family Medicine

## 2015-06-14 ENCOUNTER — Ambulatory Visit (INDEPENDENT_AMBULATORY_CARE_PROVIDER_SITE_OTHER): Payer: Self-pay | Admitting: Family Medicine

## 2015-06-14 DIAGNOSIS — M79674 Pain in right toe(s): Secondary | ICD-10-CM | POA: Insufficient documentation

## 2015-06-14 DIAGNOSIS — Z30430 Encounter for insertion of intrauterine contraceptive device: Secondary | ICD-10-CM

## 2015-06-14 DIAGNOSIS — M79675 Pain in left toe(s): Secondary | ICD-10-CM | POA: Insufficient documentation

## 2015-06-14 DIAGNOSIS — Z975 Presence of (intrauterine) contraceptive device: Secondary | ICD-10-CM

## 2015-06-14 DIAGNOSIS — D509 Iron deficiency anemia, unspecified: Secondary | ICD-10-CM

## 2015-06-14 LAB — POCT URINE PREGNANCY: Preg Test, Ur: NEGATIVE

## 2015-06-14 MED ORDER — LEVONORGESTREL 20 MCG/24HR IU IUD
INTRAUTERINE_SYSTEM | Freq: Once | INTRAUTERINE | Status: AC
  Administered 2015-06-14: 1 via INTRAUTERINE

## 2015-06-14 MED ORDER — IBUPROFEN 600 MG PO TABS: 600.0000 mg | ORAL_TABLET | Freq: Four times a day (QID) | ORAL | Status: DC

## 2015-06-14 MED ORDER — FERROUS SULFATE 325 (65 FE) MG PO TABS: 325.0000 mg | ORAL_TABLET | Freq: Every day | ORAL | Status: DC

## 2015-06-14 NOTE — Progress Notes (Signed)
Subjective:    Patient ID: Susan Freeman, female    DOB: Mar 03, 1991, 24 y.o.   MRN: [id number]  [name], [name], present during history and exam.  Susan Freeman is a 24 y.o. female presenting on 06/14/2015 for Postpartum Care  HPI  POSTPARTUM FOLLOW-UP: - Presents today 6 weeks postpartum. Last seen at 2 weeks postpartum. Overall states she and her baby are doing well. States her previous LBP and pelvic pains have resolved, completed Ibuprofen course. Additionally resolved bilateral hand numbness from before, thought to be carpal tunnel from swelling. - History of iron deficiency anemia during pregnancy, discharge Hgb 8.8 (previuosly 10-11) during pregnancy. Started on daily ferrous sulfate , tolerated well, took for 1 month postpartum, now out. No GI side-effects. - Breastfeeding well, without problems - Weight loss down to 111, from peak 131 lb during 3rd trimester, normal appetite, healthy lifestyle - LMP - none since pregnancy. Normally regular monthly, not heavy bleeding. - Denies any concerns about mood, previous PP Depression scree normal, PHQ9: 0 - Denies vaginal bleeding (resolved lochia), HA, fevers/chills, edema, nausea / vomiting, tingling, weakness, constipation  BILATERAL TOE PAIN: - Reports symptom with bilateral little toe pain and discomfort with some mild swelling. Present for 3 weeks, difficulty with wearing certain shoes that are tight. Describes pain mostly with ambulation and weight bearing on toes, at rest no pain. - Tried ibuprofen with some relief (request refill). Cream (unclear which) helps some - No known injury - Denies any rash, laceration, numbness, tingling  CONTRACEPTION: - Here for Mirena IUD insertion, completed paperwork for scholarship which was approved. Discussed risks, benefits, and effects.  No past medical history on file.  Current Outpatient Prescriptions on File Prior to Visit  Medication Sig  .  cyclobenzaprine (FLEXERIL) 10 MG tablet Take 1 tablet (10 mg total) by mouth 3 (three) times daily as needed for muscle spasms.  Marland Kitchen docusate sodium (COLACE) 100 MG capsule Take 1 capsule (100 mg total) by mouth 2 (two) times daily as needed for mild constipation.  Marland Kitchen lanolin OINT Apply 1 application topically as needed (for breast care).  . Prenatal Vit-Fe Fumarate-FA (PRENATAL MULTIVITAMIN) TABS tablet Take 1 tablet by mouth daily at 12 noon.   No current facility-administered medications on file prior to visit.    Review of Systems Per HPI unless specifically indicated above     Objective:    BP 106/60 mmHg  Pulse 67  Temp(Src) 98.1 F (36.7 C) (Oral)  Wt 111 lb (50.349 kg)  Wt Readings from Last 3 Encounters:  06/14/15 111 lb (50.349 kg)  05/17/15 110 lb 8 oz (50.122 kg)  05/04/15 131 lb (59.421 kg)    Physical Exam Results for orders placed or performed in visit on 06/14/15  POCT urine pregnancy  Result Value Ref Range   Preg Test, Ur Negative Negative     PROCEDURE NOTE: IUD (MIRENA) PLACEMENT - 06/14/15 The risks and benefits of the method and placement have been thouroughly reviewed with the patient and all questions were answered. Consent signed and dated, to be scanned. Time out was performed. A speculum was placed to obtain view of cervix . The cervix was prepped using Betadine swabs x 3. The uterus was sounded to approx 6.5cm. The IUD was inserted to 6.5 cm.  It was pulled back 1 cm and the IUD was disengaged and then reintroduced to maximum depth, inserter device was gently removed without any sign of IUD device, and strings noted to be in appropriate  position. The strings were trimmed to aprox 3-4 cm. Speculum removed. Small amount of bleeding during procedure.     Assessment & Plan:   Problem List Items Addressed This Visit      Other   RESOLVED: Encounter for insertion of mirena IUD   Relevant Medications   levonorgestrel (MIRENA) 20 MCG/24HR IUD (Completed)     Iron deficiency anemia   Relevant Medications   ferrous sulfate 325 (65 FE) MG tablet   IUD contraception   Mid back pain on right side   Relevant Medications   ibuprofen (ADVIL,MOTRIN) 600 MG tablet   Postpartum follow-up - Primary   Toe pain, bilateral   Relevant Medications   ibuprofen (ADVIL,MOTRIN) 600 MG tablet    Other Visit Diagnoses    Encounter for other contraceptive management        Relevant Orders    POCT urine pregnancy (Completed)       Meds ordered this encounter  Medications  . ibuprofen (ADVIL,MOTRIN) 600 MG tablet    Sig: Take 1 tablet (600 mg total) by mouth every 6 (six) hours.    Dispense:  30 tablet    Refill:  0  . ferrous sulfate 325 (65 FE) MG tablet    Sig: Take 1 tablet (325 mg total) by mouth daily with breakfast.    Dispense:  30 tablet    Refill:  2  . levonorgestrel (MIRENA) 20 MCG/24HR IUD    Sig:       Follow up plan: Return in about 2 months (around 08/14/2015) for IUD follow-up, anemia.  Saralyn Pilar, DO [facility], PGY-3

## 2015-06-14 NOTE — Assessment & Plan Note (Signed)
Bilateral lateral 5th/little toe pain with mild swelling x 3 weeks, likely from pregnancy related edema (also increased heat recently), and compression / friction from tight shoes (currently wearing) - No evidence of rash, ecchymosis, infection, no trauma  Plan: 1. Refilled Ibuprofen PRN use (also can use now for IUD insertion cramping) 2. Follow-up

## 2015-06-14 NOTE — Assessment & Plan Note (Signed)
6 week postpartum, NSVD 7/6 - Doing well, no complications - Breastfeeding - Resolved back pain, hand numbness - Refilled ferrous sulfate daily x 2 more month - Mirena IUD insertion today

## 2015-06-14 NOTE — Patient Instructions (Signed)
Estimado Susan Freeman , Gracias por venir a Estate manager/land agent . Fue bueno verte!  1. En general , como sonidos que estn [employer] 6 semanas despus del [place] . 2. rellen Ibuprofeno 600 mg - tomar cada 6 horas segn sea necesario para el dolor , sin embargo, no tome este derecho antes de Museum/gallery exhibitions officer . Puede ser en la [place]. - Su dolor en el dedo suena como que se debe a un poco de hinchazn del Psychiatrist y la compresin de los zapatos apretados . Trate de usar chanclas o zapatos abiertos durante unas pocas semanas . Use bolsas de hielo y el ibuprofeno , segn sea necesario 3. Tomar hierro complementar al da durante 2 meses ms , y luego se detiene . recarga enviado  Por favor haga un seguimiento de 2 meses para comprobar el DIU , antes si es necesario .  Si tiene cualquier otra pregunta o inquietud, por favor no dude en llamar a la clnica en contacto conmigo. Tambin puede programar una cita antes si es necesario .  Sin embargo , si sus Sport and exercise psychologist , por favor acuda a la sala de emergencia para buscar atencin mdica inmediata .  Saralyn Pilar , DO [facility]  ------------  Dear Susan Freeman, Thank you for coming in to clinic today. It was good to see you!  1. Overall sounds like you are doing well 6 weeks after delivery. 2. Refilled Ibuprofen  - take every 6 hours as needed for pain, however do not take this RIGHT before breastfeeding. It may be in your breast milk. - Your toe pain sounds like it is due to some swelling from pregnancy and compression from tight shoes. Try to wear flip flops or open shoes for a few weeks. Use ice packs and ibuprofen as needed 3. Take Iron supplement daily for 2 more months, then stop. Sent refill  Please schedule follow-up in 2 months to check on IUD, sooner if needed.  If you have any other questions or concerns, please feel free to call the clinic to contact me. You may also schedule an  earlier appointment if necessary.  However, if your symptoms get significantly worse, please go to the Emergency Department to seek immediate medical attention.  Saralyn Pilar, DO [facility]

## 2015-06-14 NOTE — Assessment & Plan Note (Signed)
Refilled ferrous sulfate  daily x 2 more months, tolerating well - Repeat CBC 3-6 mo

## 2015-06-14 NOTE — Assessment & Plan Note (Signed)
Mirena IUD inserted today, 06/14/15 - good for 5 years 06/13/20 - Obtained Mirena IUD from scholarship financial aid - No complications. Tolerated well. See procedure note for details - Strings trimmed to 3-4 cm, consider trim further if needed

## 2015-06-16 ENCOUNTER — Ambulatory Visit: Payer: Self-pay | Admitting: Family Medicine

## 2015-08-04 ENCOUNTER — Ambulatory Visit (INDEPENDENT_AMBULATORY_CARE_PROVIDER_SITE_OTHER): Payer: Self-pay | Admitting: Family Medicine

## 2015-08-04 ENCOUNTER — Encounter: Payer: Self-pay | Admitting: Family Medicine

## 2015-08-04 VITALS — BP 100/54 | HR 57 | Temp 98.2°F | Ht 59.0 in | Wt 106.0 lb

## 2015-08-04 DIAGNOSIS — Z975 Presence of (intrauterine) contraceptive device: Secondary | ICD-10-CM

## 2015-08-04 DIAGNOSIS — D509 Iron deficiency anemia, unspecified: Secondary | ICD-10-CM

## 2015-08-04 DIAGNOSIS — M79674 Pain in right toe(s): Secondary | ICD-10-CM

## 2015-08-04 DIAGNOSIS — R5383 Other fatigue: Secondary | ICD-10-CM

## 2015-08-04 DIAGNOSIS — Z23 Encounter for immunization: Secondary | ICD-10-CM

## 2015-08-04 DIAGNOSIS — R531 Weakness: Secondary | ICD-10-CM

## 2015-08-04 DIAGNOSIS — M79675 Pain in left toe(s): Secondary | ICD-10-CM

## 2015-08-04 LAB — POCT HEMOGLOBIN: Hemoglobin: 13.7 g/dL (ref 12.2–16.2)

## 2015-08-04 MED ORDER — IBUPROFEN 600 MG PO TABS: 600.0000 mg | ORAL_TABLET | Freq: Three times a day (TID) | ORAL | Status: DC | PRN

## 2015-08-04 NOTE — Patient Instructions (Signed)
Estimado Susan Freeman, Gracias por venir a Estate manager/land agent. Fue bueno verte!  1. Su nivel de hierro es [employer]. Volver a la normalidad. Terminar de tomar su suplemento de hierro entonces se hacen.  2. Sus sonidos del dedo del pie contra el dolor pueden deberse a los huesos y los arcos de los pies, no veo ninguna hinchazn en la actualidad. Creo que no hay huesos fracturados, y puede ser residual de su embarazo. Pero recomiendo seguir tomando ibuprofeno, segn sea necesario. Es posible utilizar una botella de agua o hielo congelado para frotar la parte inferior de su pie en proporcionar alivio, tratar de estirar las plantas de los pies tambin. - Remisin a [facility] Clinic hoy en da, que le llamar con Lynford Citizen, para discutir este problema adicional  como es debido a alguna hinchazn causada por el Psychiatrist y la compresin de los zapatos apretados. Trate de usar chanclas o zapatos abiertos durante unas pocas semanas. Use bolsas de hielo y el ibuprofeno, segn sea necesario  3. DIU suena como que est funcionando normalmente. Por favor verifique en casa con los dedos para sentir las cuerdas. No tire de ellos, pero comprobar para ver si estn en un mismo lugar. Haga esto una vez cada 1-3 meses. Puede continuar desarrollando algunos calambres ocasionales o sangrado por disrupcin menstrual dentro de los primeros 6 meses de tener el DIU en su lugar, esto es normal.  Si tiene cualquier otra pregunta o inquietud, por favor no dude en llamar a la clnica en contacto conmigo. Tambin puede programar una cita antes si es necesario.  Sin embargo, si sus Sport and exercise psychologist, por favor acuda a la sala de emergencia para buscar atencin mdica inmediata.  Saralyn Pilar, DO [facility]  ------------  Dear Susan Freeman, Thank you for coming in to clinic today. It was good to see you!  1. Your iron level is much better. Back to normal. Finish taking your iron  supplement then you are done.  2. Your toe pain sounds may be due to the bones and arches in your feet, I do not see any swelling today. I do not think any bones are broken, and it may be residual from your pregnancy. But I recommend keep taking Ibuprofen as needed. You may use a frozen water bottle or ice pack to rub the bottom of your foot on to provide relief, try to stretch the bottoms of your feet as well. - Referral to Sports Medicine Clinic today, they will call you with an appointment, to discuss this problem further  like it is due to some swelling from pregnancy and compression from tight shoes. Try to wear flip flops or open shoes for a few weeks. Use ice packs and ibuprofen as needed  3. IUD sounds like it is working normally. Please check it at home with your fingers to feel the strings. Do not pull on them but check to see if they are in same place. Do this once every 1-3 months. You may still develop some occasional cramping or break through menstrual bleeding within first 6 months of having the IUD in place, this is normal.  If you have any other questions or concerns, please feel free to call the clinic to contact me. You may also schedule an earlier appointment if necessary.  However, if your symptoms get significantly worse, please go to the Emergency Department to seek immediate medical attention.  Saralyn Pilar, DO [facility]

## 2015-08-04 NOTE — Progress Notes (Signed)
Subjective:    Patient ID: Susan Freeman, female    DOB: 01-Jan-1991, 24 y.o.   MRN: [id number]  Susan Freeman is a 24 y.o. female presenting on 08/04/2015 for Foot Pain and Fatigue  [name] (Video) Katherin [id number]  HPI  BILATERAL TOE PAIN / NUMBNESS: - Right > Left, no numbness, pain since last exam seems to be persistent with mild worsening, worse with ambulation. Denies any injury, fall, stubbing toes. Improved with ibuprofen, resting and elevating feet. - Now with resolved swelling, did have some edema initially when presented about 2 months ago following pregnancy - Denies any edema. rash, laceration, numbness, tingling  FATIGUE / IRON DEFICIENCY ANEMIA IN PREGNANCY: - Today checked Hgb at 13.7, previously after delivery was down to Hgb 8.8 - Continues to take ferrous sulfate  daily, had prescribed for total 3 months after pregnancy - Admits to feeling fatigued or tired but thinks that this is due to her current schedule with new baby and poor sleep - Denies any caffeine consumption - Denies any weakness, CP, SOB  CONTRACEPTION: - Recently had Mirena IUD placed on 06/14/15 - Denies any complaints with Mirena IUD - No LMP. Has not had further period since placement of Mirena - Has not been doing self checks  No past medical history on file.  Social History   Social History  . Marital Status: Significant Other    Spouse Name: N/A  . Number of Children: N/A  . Years of Education: N/A   Occupational History  . Not on file.   Social History Main Topics  . Smoking status: Never Smoker   . Smokeless tobacco: Not on file  . Alcohol Use: No  . Drug Use: No  . Sexual Activity: No   Other Topics Concern  . Not on file   Social History Narrative    Current Outpatient Prescriptions on File Prior to Visit  Medication Sig  . cyclobenzaprine (FLEXERIL) 10 MG tablet Take 1 tablet (10 mg total) by mouth 3 (three) times daily as needed for  muscle spasms.  Marland Kitchen docusate sodium (COLACE) 100 MG capsule Take 1 capsule (100 mg total) by mouth 2 (two) times daily as needed for mild constipation.  . ferrous sulfate 325 (65 FE) MG tablet Take 1 tablet (325 mg total) by mouth daily with breakfast.  . lanolin OINT Apply 1 application topically as needed (for breast care).  . Prenatal Vit-Fe Fumarate-FA (PRENATAL MULTIVITAMIN) TABS tablet Take 1 tablet by mouth daily at 12 noon.   No current facility-administered medications on file prior to visit.    Review of Systems  Constitutional: Positive for fatigue (mild fatigue vs tiredness). Negative for fever, chills, diaphoresis, activity change and appetite change.  HENT: Negative for congestion and hearing loss.   Eyes: Negative for visual disturbance.  Respiratory: Negative for cough, chest tightness, shortness of breath and wheezing.   Cardiovascular: Negative for chest pain, palpitations and leg swelling (resolved).  Gastrointestinal: Negative for nausea, vomiting, abdominal pain, diarrhea and constipation.  Genitourinary: Negative for dysuria, frequency and hematuria.  Musculoskeletal: Positive for arthralgias (bilateral toe pain) and gait problem (able to ambulate without difficulty but feels toe pain with ambulation). Negative for neck pain.  Skin: Negative for rash.  Neurological: Negative for dizziness, weakness, light-headedness, numbness and headaches.  Hematological: Negative for adenopathy.  Psychiatric/Behavioral: Positive for sleep disturbance (caring for newborn baby). Negative for behavioral problems, confusion, dysphoric mood and decreased concentraCarole Civil not nervous/anxious.    Per HPI  unless specifically indicated above     Objective:    BP 100/54 mmHg  Pulse 57  Temp(Src) 98.2 F (36.8 C) (Oral)  Ht  (1.499 m)  Wt 106 lb (48.081 kg)  BMI 21.40 kg/m2  Breastfeeding? Yes  Wt Readings from Last 3 Encounters:  08/04/15 106 lb (48.081 kg)    06/14/15 111 lb (50.349 kg)  05/17/15 110 lb 8 oz (50.122 kg)    Physical Exam  Constitutional: She is oriented to person, place, and time. She appears well-developed and well-nourished. No distress.  HENT:  Head: Normocephalic and atraumatic.  Mouth/Throat: Oropharynx is clear and moist.  Neck: Normal range of motion. Neck supple. No thyromegaly present.  Cardiovascular: Normal rate, regular rhythm, normal heart sounds and intact distal pulses.   No murmur heard. Musculoskeletal: Normal range of motion. She exhibits no edema or tenderness.  Full active ROM b/l feet. Neurovascularly intact. Mild +TTP over compression of all toes bilaterally except great toes, mostly localized Right > Left over 3-5th digits, with some extension into forefoot / metatarsals, mid and hind foot nontender, ankle stable and non-tender, achilles intact without tenderness. Able to ambulate without difficulty,  Lymphadenopathy:    She has no cervical adenopathy.  Neurological: She is alert and oriented to person, place, and time. She exhibits normal muscle tone.  Skin: Skin is warm and dry. No rash noted. She is not diaphoretic. No erythema.  Psychiatric: She has a normal mood and affect. Her behavior is normal.  Nursing note and vitals reviewed.  Results for orders placed or performed in visit on 08/04/15  Hemoglobin  Result Value Ref Range   Hemoglobin 13.7 12.2 - 16.2 g/dL      Assessment & Plan:   Problem List Items Addressed This Visit      Other   Fatigue    Persistent about 3 months after pregnancy, during pregnancy had IDA since resolved with ferrous sulfate replacement. More consistent with tiredness with caring for newborn and poor sleep. No evidence of weakness or other complicating symptoms.  Plan: 1. Counseled on trying to care for herself at same time of providing for her newborn, importance of sleep hygiene 2. Finish ferrous sulfate 3. Follow-up      Iron deficiency anemia    Resolved.  Prior IDA in pregnancy with Hgb down to 8.8 following delivery. Tolerated ferrous sulfate well. Unlikely to be cause of her fatigue, seems much more likely tired from daily routine, poor sleep, caring for newborn.  Plan: 1. Re-checked POCT Hgb today at 13.7 2. Finish remaining course of ferrous sulfate  daily (had given total 3 month postpartum course)      Relevant Orders   Hemoglobin (Completed)   IUD contraception    No concerns today. Mirena IUD in place per report. Has not been self checking regularly. No breakthrough bleeding or any symptoms.  Plan: 1. Advised regular self checks, and declined pelvic today 2. Follow-up as needed, can do pelvic if complaints or at next follow-up if desired      Toe pain, bilateral - Primary    Bilateral lateral toe pain R>L, seems localized to digits 3-5 mid toe with some extension to metatarsals and forefoot. Similar to last visit 2 months ago and persistent / worsening, now with resolved pregnancy related edema. - No injury or trauma. No rash, ecchymosis or infection - Reassuring that improves with rest, elevation, off feet, and ibuprofen PRN  Plan: 1. Referral to Sports Medicine for further evaluation, seems  pregnancy related vs overuse. Without trauma suspect x-ray would be limited utility. 2. Refilled Ibuprofen PRN use 3. Advised may do foot stretching exercises, ice pack / frozen water bottle, rest, elevation      Relevant Medications   ibuprofen (ADVIL,MOTRIN) 600 MG tablet    Other Visit Diagnoses    Encounter for immunization           Meds ordered this encounter  Medications  . ibuprofen (ADVIL,MOTRIN) 600 MG tablet    Sig: Take 1 tablet (600 mg total) by mouth every 8 (eight) hours as needed.    Dispense:  30 tablet    Refill:  1      Follow up plan: Return in about 4 weeks (around 09/01/2015) for toe pain.  A total of 25 minutes was spent face-to-face with this patient. Over half this time was spent on counseling  patient on the diagnosis and different diagnostic and therapeutic options available.  Saralyn Pilar, DO [facility], PGY-3

## 2015-08-05 DIAGNOSIS — R5383 Other fatigue: Secondary | ICD-10-CM | POA: Insufficient documentation

## 2015-08-05 NOTE — Assessment & Plan Note (Signed)
Bilateral lateral toe pain R>L, seems localized to digits 3-5 mid toe with some extension to metatarsals and forefoot. Similar to last visit 2 months ago and persistent / worsening, now with resolved pregnancy related edema. - No injury or trauma. No rash, ecchymosis or infection - Reassuring that improves with rest, elevation, off feet, and ibuprofen PRN  Plan: 1. Referral to Sports Medicine for further evaluation, seems pregnancy related vs overuse. Without trauma suspect x-ray would be limited utility. 2. Refilled Ibuprofen PRN use 3. Advised may do foot stretching exercises, ice pack / frozen water bottle, rest, elevation

## 2015-08-05 NOTE — Assessment & Plan Note (Signed)
Persistent about 3 months after pregnancy, during pregnancy had IDA since resolved with ferrous sulfate replacement. More consistent with tiredness with caring for newborn and poor sleep. No evidence of weakness or other complicating symptoms.  Plan: 1. Counseled on trying to care for herself at same time of providing for her newborn, importance of sleep hygiene 2. Finish ferrous sulfate 3. Follow-up

## 2015-08-05 NOTE — Assessment & Plan Note (Signed)
Resolved. Prior IDA in pregnancy with Hgb down to 8.8 following delivery. Tolerated ferrous sulfate well. Unlikely to be cause of her fatigue, seems much more likely tired from daily routine, poor sleep, caring for newborn.  Plan: 1. Re-checked POCT Hgb today at 13.7 2. Finish remaining course of ferrous sulfate  daily (had given total 3 month postpartum course)

## 2015-08-05 NOTE — Assessment & Plan Note (Signed)
No concerns today. Mirena IUD in place per report. Has not been self checking regularly. No breakthrough bleeding or any symptoms.  Plan: 1. Advised regular self checks, and declined pelvic today 2. Follow-up as needed, can do pelvic if complaints or at next follow-up if desired

## 2015-11-17 ENCOUNTER — Other Ambulatory Visit (HOSPITAL_COMMUNITY)
Admission: RE | Admit: 2015-11-17 | Discharge: 2015-11-17 | Disposition: A | Discharge status: Home / Self Care | Payer: Self-pay | Source: Ambulatory Visit | Attending: Family Medicine | Admitting: Family Medicine

## 2015-11-17 ENCOUNTER — Ambulatory Visit (INDEPENDENT_AMBULATORY_CARE_PROVIDER_SITE_OTHER): Payer: Self-pay | Admitting: Family Medicine

## 2015-11-17 ENCOUNTER — Encounter: Payer: Self-pay | Admitting: Family Medicine

## 2015-11-17 VITALS — BP 95/55 | HR 67 | Temp 98.4°F | Ht 59.0 in | Wt 106.8 lb

## 2015-11-17 DIAGNOSIS — R109 Unspecified abdominal pain: Secondary | ICD-10-CM | POA: Insufficient documentation

## 2015-11-17 DIAGNOSIS — Z124 Encounter for screening for malignant neoplasm of cervix: Secondary | ICD-10-CM

## 2015-11-17 DIAGNOSIS — Z113 Encounter for screening for infections with a predominantly sexual mode of transmission: Secondary | ICD-10-CM | POA: Insufficient documentation

## 2015-11-17 DIAGNOSIS — Z01419 Encounter for gynecological examination (general) (routine) without abnormal findings: Secondary | ICD-10-CM | POA: Insufficient documentation

## 2015-11-17 LAB — POCT WET PREP (WET MOUNT)
Clue Cells Wet Prep Whiff POC: NEGATIVE
WBC, Wet Prep HPF POC: >20

## 2015-11-17 NOTE — Progress Notes (Signed)
ABDOMINAL PAIN Pain in the abdomen around the umbilicus. Started 3 days ago. Got worse last night. No new activities, no nausea, vomiting, diarrhea. Radiates to bilateral lower abdomen. No association w/ food (better/worse) No vaginal bleeding/discharge. Yes to pain w/ intercourse.   Pain began 3 days ago Medications tried: tylenol, helped a little Similar pain before: no Prior abdominal surgeries: no  Symptoms Nausea/vomiting: no Diarrhea: no Constipation: no; last BM was last night Blood in stool: no Blood in vomit: n/a Fever: no Dysuria: no Loss of appetite: no Weight loss: no  Vaginal Bleeding: no Missed menstrual period: no; IUD in place  Review of Symptoms - see HPI PMH - Smoking status noted.    Objective: BP 95/55 mmHg  Pulse 67  Temp(Src) 98.4 F (36.9 C) (Oral)  Ht  (1.499 m)  Wt 106 lb 12.8 oz (48.444 kg)  BMI 21.56 kg/m2 Gen: NAD, alert, cooperative, and pleasant. HEENT: NCAT, EOMI, PERRL CV: RRR, no murmur Resp: CTAB, no wheezes, non-labored Abd: S, ND, tenderness over the umbilicus and in the RLQ, BS present, no guarding or organomegaly, Murphy's negative, psoas/operator negative. Female genitalia: Vulva: normal appearing vulva with no masses, tenderness or lesions Vagina: normal appearing vagina with normal color and discharge, no lesions Cervix: cervical discharge present - clear and thick, cervical motion tenderness present, friable to exam, rough in texture and multiparous os   Assessment and plan:  AP (abdominal pain) Patient presents with a three-day history of abdominal pain localized to the umbilicus and radiating down to the right lower quadrant. Etiology currently unknown. Patient endorsed newfound pain with intercourse, but no dysuria or reports of fever. After abdominal exam a pelvic exam seem to be warranted. On pelvic exam cervix appeared inflamed and was extremely tender to all motion. Surface was friable. No vaginal lesions, odors, or  significant discharge appreciated. Bimanual exam confirmed cervical motion tenderness without masses. - Labs obtained: Wet prep, GC/chlamydia, Pap smear - We'll wait for lab results to come back before further medical management is prescribed. - Encouraged keeping her fluids up, Tylenol for pain/discomfort, MiraLAX/prune juice if abdominal pain has any cramping nature or association with BMs.    Orders Placed This Encounter  Procedures  . POCT Wet Prep [facility] [place])    Kathee Delton, MD,MS,  PGY2 11/17/2015 12:36 PM

## 2015-11-17 NOTE — Assessment & Plan Note (Signed)
Patient presents with a three-day history of abdominal pain localized to the umbilicus and radiating down to the right lower quadrant. Etiology currently unknown. Patient endorsed newfound pain with intercourse, but no dysuria or reports of fever. After abdominal exam a pelvic exam seem to be warranted. On pelvic exam cervix appeared inflamed and was extremely tender to all motion. Surface was friable. No vaginal lesions, odors, or significant discharge appreciated. Bimanual exam confirmed cervical motion tenderness without masses. - Labs obtained: Wet prep, GC/chlamydia, Pap smear - We'll wait for lab results to come back before further medical management is prescribed. - Encouraged keeping her fluids up, Tylenol for pain/discomfort, MiraLAX/prune juice if abdominal pain has any cramping nature or association with BMs.

## 2015-11-17 NOTE — Patient Instructions (Signed)
It was a pleasure seeing you today in our clinic. Today we discussed your abdominal pain. Here is the treatment plan we have discussed and agreed upon together:   - I have ordered some labs today. I will contact you with the lab results in the next couple days. - If your symptoms persist or worsen over the next 3-5 days do not hesitate to call our office for another appointment or to report to the nearest emergency department for reevaluation. - Taking over-the-counter MiraLAX or prune juice may help with some of this abdominal pain. - Tylenol for immediate pain relief may also help.

## 2015-11-18 LAB — CERVICOVAGINAL ANCILLARY ONLY
CHLAMYDIA, DNA PROBE: NEGATIVE
Neisseria Gonorrhea: NEGATIVE

## 2015-11-21 LAB — CYTOLOGY - PAP

## 2015-12-23 ENCOUNTER — Ambulatory Visit: Payer: Self-pay

## 2016-01-02 ENCOUNTER — Telehealth: Payer: Self-pay | Admitting: Family Medicine

## 2016-01-02 DIAGNOSIS — K029 Dental caries, unspecified: Secondary | ICD-10-CM

## 2016-01-02 NOTE — Telephone Encounter (Signed)
Ordered referral for Dentist for dental cavity.  Susan PilarAlexander Varun Jourdan, DO [facility], PGY-3

## 2016-01-02 NOTE — Telephone Encounter (Signed)
Patient asking for referral to [facility] due to cavities.

## 2016-01-02 NOTE — Telephone Encounter (Signed)
Patient has the orange card and is needing a referral to the dental clinic.

## 2016-05-27 NOTE — Progress Notes (Signed)
25 y.o. year old female presents for well woman/preventative visit and annual GYN examination.  Acute Concerns: none  Diet: yes, well-balanced  Exercise: 30 minutes once per week  Sexual/Birth History: sexually active, G2P2002  Birth Control: IUD  POA/Living Will: no  Social:  Social History   Social History  . Marital status: Significant Other    Spouse name: N/A  . Number of children: N/A  . Years of education: N/A   Social History Main Topics  . Smoking status: Never Smoker  . Smokeless tobacco: Never Used  . Alcohol use No  . Drug use: No  . Sexual activity: Yes    Birth control/ protection: IUD   Other Topics Concern  . None   Social History Narrative  . None    Immunization: Immunization History  Administered Date(s) Administered  . Influenza,inj,Quad PF,36+ Mos 12/16/2014, 08/04/2015  . Tdap 04/07/2013, 02/11/2015    Cancer Screening:  Pap Smear: 11/17/15 (neg)  Mammogram: none  Colonoscopy: none  Dexa: none    Physical Exam: VITALS: Reviewed GEN: Pleasant female, NAD HEENT: Normocephalic, PERRL, EOMI, no scleral icterus, bilateral TM pearly grey, nasal septum midline, MMM, uvula midline, no anterior or posterior lymphadenopathy, no thyromegaly CARDIAC:RRR, S1 and S2 present, no murmur, no heaves/thrills RESP: CTAB, normal effort ABD: soft, no tenderness, normal bowel sounds EXT: No edema, 2+ radial and DP pulses SKIN: no rash  ASSESSMENT & PLAN: 25 y.o. female presents for annual well woman/preventative exam and GYN exam. Please see problem specific assessment and plan.   No problem-specific Assessment & Plan notes found for this encounter.

## 2016-05-28 ENCOUNTER — Encounter: Payer: Self-pay | Admitting: Family Medicine

## 2016-05-28 ENCOUNTER — Ambulatory Visit (INDEPENDENT_AMBULATORY_CARE_PROVIDER_SITE_OTHER): Payer: Self-pay | Admitting: Family Medicine

## 2016-05-28 VITALS — BP 110/78 | Temp 98.6°F | Wt 112.0 lb

## 2016-05-28 DIAGNOSIS — Z0000 Encounter for general adult medical examination without abnormal findings: Secondary | ICD-10-CM

## 2016-05-28 DIAGNOSIS — Z23 Encounter for immunization: Secondary | ICD-10-CM

## 2016-05-28 NOTE — Addendum Note (Signed)
Addended by: Garen Grams F on: 05/28/2016 05:11 PM   Modules accepted: Orders

## 2016-05-28 NOTE — Patient Instructions (Addendum)
Fue Optometrist. Vea a continuacin para revisar nuestro plan para la visita de hoy.  1. Su examen fsico anual era normal hoy. 2. Aumentar el ejercicio a 150 minutos por semana si es posible. 3. Evite la comida rpida excesiva y mantenga alrededor de 2 tazas de ingesta de vegetales diariamente. 4. La vacuna contra el VPH puede ser dolorosa en el lugar de la inyeccin y usted puede tener dolor de [place], pero stos Environmental health practitioner. 5. Haga un seguimiento en 4 semanas para su vacuna contra el HPV.  Llame a la clnica al [phone] si sus sntomas empeoran o si tiene alguna preocupacin. Que tengas un gran da. - Dr. Abelardo Diesel, DO [facility]

## 2016-05-28 NOTE — Addendum Note (Signed)
Addended by: Garen Grams F on: 05/28/2016 12:37 PM   Modules accepted: Orders

## 2016-06-20 ENCOUNTER — Ambulatory Visit: Payer: Self-pay | Attending: Internal Medicine

## 2016-12-29 ENCOUNTER — Other Ambulatory Visit: Payer: Self-pay | Admitting: Family Medicine

## 2016-12-29 DIAGNOSIS — M549 Dorsalgia, unspecified: Secondary | ICD-10-CM

## 2017-02-05 ENCOUNTER — Ambulatory Visit: Payer: Self-pay

## 2017-09-04 ENCOUNTER — Ambulatory Visit: Payer: Self-pay

## 2017-12-24 ENCOUNTER — Other Ambulatory Visit: Payer: Self-pay

## 2017-12-24 ENCOUNTER — Encounter: Payer: Self-pay | Admitting: Family Medicine

## 2017-12-24 ENCOUNTER — Ambulatory Visit (INDEPENDENT_AMBULATORY_CARE_PROVIDER_SITE_OTHER): Payer: Self-pay | Admitting: Family Medicine

## 2017-12-24 VITALS — BP 100/68 | HR 76 | Temp 98.4°F | Ht 58.5 in | Wt 135.0 lb

## 2017-12-24 DIAGNOSIS — Z0000 Encounter for general adult medical examination without abnormal findings: Secondary | ICD-10-CM

## 2017-12-24 DIAGNOSIS — Z23 Encounter for immunization: Secondary | ICD-10-CM

## 2017-12-24 DIAGNOSIS — M545 Low back pain, unspecified: Secondary | ICD-10-CM

## 2017-12-24 DIAGNOSIS — M549 Dorsalgia, unspecified: Secondary | ICD-10-CM | POA: Insufficient documentation

## 2017-12-24 DIAGNOSIS — R300 Dysuria: Secondary | ICD-10-CM | POA: Insufficient documentation

## 2017-12-24 LAB — POCT URINALYSIS DIP (MANUAL ENTRY)
BILIRUBIN UA: NEGATIVE mg/dL
Bilirubin, UA: NEGATIVE
Glucose, UA: NEGATIVE mg/dL
Nitrite, UA: NEGATIVE
Protein Ur, POC: NEGATIVE mg/dL
SPEC GRAV UA: 1.025 (ref 1.010–1.025)
Urobilinogen, UA: 0.2 E.U./dL
pH, UA: 6.5 (ref 5.0–8.0)

## 2017-12-24 LAB — POCT UA - MICROSCOPIC ONLY

## 2017-12-24 LAB — POCT URINE PREGNANCY: PREG TEST UR: NEGATIVE

## 2017-12-24 NOTE — Assessment & Plan Note (Addendum)
Acute.  Localized to right paraspinal musculature without signs of sciatica or cauda equina.  Suspect this is secondary to paraspinal muscular strain given improvement with Tylenol use. - Advised patient to continue Tylenol as needed - RTC in 1 month if symptoms do not improve

## 2017-12-24 NOTE — Patient Instructions (Addendum)
Thank you for coming in to see us today. Please see below to review our plan for today's visit.  Return in 6 weeks if your back pain does not improve.  To new Tylenol use as needed.  Please call the clinic at [phone] if your symptoms worsen or you have any concerns. It was our pleasure to serve you.  [name] Phillipe Clemon, DO [facility], PGY-2

## 2017-12-24 NOTE — Progress Notes (Signed)
   Subjective   Patient ID: Susan Freeman    DOB: 07/15/1991, 27 y.o. female   MRN: [id number]  CC: "Physical exam"  HPI: Susan Freeman is a 27 y.o. female who presents to clinic today for the following:  Right lower back pain: Onset 2 weeks ago.  No history of trauma.  No prior history of back pain.  Localized to right lower back.  Pain is relieved with use of Tylenol.  Pain tends to be aching and intermittent.  Patient denies fevers or chills, nausea or vomiting, as of breath, chest pain, joint pain, muscle aches, loss of bowel bladder control, saddle anesthesia.  Foul odor in urine: Noticed over the last week.  Patient denies having polyuria.  She denies any fevers or chills or flank pain.  History of urinary infections.  She denies any gross hematuria.  ROS: see HPI for pertinent.  PMFSH: None.  Surgical history unremarkable.  Family history unremarkable.. Smoking status reviewed. Medications reviewed.  Objective   BP 100/68   Pulse 76   Temp 98.4 F (36.9 C) (Oral)   Ht 4' 10.5" (1.486 m)   Wt 135 lb (61.2 kg)   SpO2 97%   BMI 27.73 kg/m  Vitals and nursing note reviewed.  General: well nourished, well developed, NAD with non-toxic appearance HEENT: normocephalic, atraumatic, moist mucous membranes Neck: supple, non-tender without lymphadenopathy Cardiovascular: regular rate and rhythm without murmurs, rubs, or gallops Lungs: clear to auscultation bilaterally with normal work of breathing Abdomen: soft, non-tender, non-distended, normoactive bowel sounds, no suprapubic tenderness Skin: warm, dry, no rashes or lesions, cap refill < 2 seconds Extremities: warm and well perfused, normal tone, no edema MSK: normal gait, 5/5 motor strength in all 4 extremities, absent costovertebral tenderness, no midline tenderness with minimal tenderness to right lower back  Assessment & Plan   Acute back pain Acute.  Localized to right paraspinal musculature without  signs of sciatica or cauda equina.  Suspect this is secondary to paraspinal muscular strain given improvement with Tylenol use. - Advised patient to continue Tylenol as needed - RTC in 1 month if symptoms do not improve  Dysuria Acute.  Patient without signs of acute cystitis or pyelonephritis.  Urine pregnancy negative.  UA with moderate blood, 1-5 RBC on microscopy but no signs of infection. - Advised patient to return in 1 week if symptoms persist  Orders Placed This Encounter  Procedures  . Flu Vaccine QUAD 36+ mos IM  . POCT urinalysis dipstick  . POCT urine pregnancy  . POCT UA - Microscopic Only   No orders of the defined types were placed in this encounter.   [name] McMullen, DO [facility], PGY-2 12/24/2017, 12:14 PM

## 2017-12-24 NOTE — Assessment & Plan Note (Addendum)
Acute.  Patient without signs of acute cystitis or pyelonephritis.  Urine pregnancy negative.  UA with moderate blood, 1-5 RBC on microscopy but no signs of infection. - Advised patient to return in 1 week if symptoms persist

## 2018-03-10 ENCOUNTER — Encounter: Payer: Self-pay | Admitting: Family Medicine

## 2018-03-10 ENCOUNTER — Ambulatory Visit: Payer: Self-pay | Admitting: Family Medicine

## 2018-03-10 ENCOUNTER — Other Ambulatory Visit: Payer: Self-pay

## 2018-03-10 VITALS — BP 116/68 | HR 76 | Temp 98.2°F | Ht <= 58 in | Wt 134.8 lb

## 2018-03-10 DIAGNOSIS — L819 Disorder of pigmentation, unspecified: Secondary | ICD-10-CM

## 2018-03-10 DIAGNOSIS — R21 Rash and other nonspecific skin eruption: Secondary | ICD-10-CM

## 2018-03-10 MED ORDER — CLOTRIMAZOLE 1 % EX CREA: 1.0000 "application " | TOPICAL_CREAM | Freq: Two times a day (BID) | CUTANEOUS | 0 refills | Status: DC

## 2018-03-10 NOTE — Patient Instructions (Addendum)
Use crema a areas 2 veces cada dia. si no mejor en 2-3 semanas,voy a referir a specialista. regrese mas temprano si rancha en otro areas or peor.    Erupcin cutnea Rash Una erupcin cutnea es un cambio en el color de la piel. Una erupcin tambin puede cambiar la forma en que se siente la piel. Hay muchas afecciones y [employer] que pueden causar una erupcin. Siga estas instrucciones en su casa: Est atento a cualquier cambio en los sntomas. Estas indicaciones pueden ayudarlo con el trastorno: [employer] o aplquese los medicamentos de venta libre y Building control surveyor como se lo haya indicado el mdico. Estos pueden incluir lo siguiente:  Crema con corticoides.  Lociones para Associate Professor.  Antihistamnicos por va oral.  Cuidado de la piel  Aplique compresas fras en las zonas afectadas.  Trate de tomar un bao con lo siguiente: ? Sales de [employer]. Siga las instrucciones del envase. Puede conseguirlas en la tienda de comestibles o la farmacia local. ? Bicarbonato de sodio. Vierta un poco en la baera como se lo haya indicado el mdico. ? Avena coloidal. Siga las instrucciones del envase. Puede conseguirla en la tienda de comestibles o la farmacia local.  Intente colocarse una pasta de bicarbonato de sodio sobre la piel. Agregue agua al bicarbonato hasta que tenga la consistencia de una pasta.  No se rasque ni se refriegue la piel.  Evite cubrir la erupcin. Asegrese de que la erupcin est expuesta al aire todo lo posible. Instrucciones generales  Evite los baos de inmersin y las duchas calientes ya que pueden empeorar la picazn. Neomia Dear ducha fra puede Acupuncturist.  Evite los detergentes y los jabones perfumados, y los perfumes. Utilice jabones, detergentes, perfumes y cosmticos suaves.  Evite las sustancias que causan la erupcin. Lleve un diario como ayuda para registrar lo que le causa erupcin. Escriba los siguientes datos: ? Lo que  come. ? Los cosmticos que [place]. ? Lo que bebe. ? La ropa que [place]. Esto incluye las alhajas.  Concurra a todas las visitas de control como se lo haya indicado el mdico. Esto es importante. Comunquese con un mdico si:  Transpira de noche.  Pierde peso.  Orina ms de lo normal.  Se siente dbil.  Vomita.  Tiene un color amarillo en la piel o en la zona blanca del ojo (ictericia).  La piel: ? Siente hormigueos. ? Se adormece.  La erupcin cutnea: ? No desaparece despus de [employer]. ? Empeora.  Usted: ? Est inusualmente sediento. ? Est ms cansado que lo habitual.  Tiene los siguientes sntomas: ? Sntomas nuevos. ? Dolor en el abdomen. ? Fiebre. ? Diarrea. Solicite ayuda de inmediato si:  Presenta una erupcin que cubre todo el cuerpo o la mayor parte de [place]. La erupcin puede ser dolorosa o no.  Tiene ampollas que tienen las siguientes caractersticas: ? Se ubican sobre la erupcin. ? Se agrandan o crecen juntas. ? Estn doloridos. ? Estn dentro de la nariz o la boca.  Le aparece una erupcin que tiene las siguientes caractersticas: ? Tiene pequeas manchas moradas, como si fueran pinchazos, en todo el cuerpo. ? Tiene un aspecto parecido a Racheal Patches o a un blanco de tiro. ? No est relacionada con una exposicin al sol, est enrojecida y duele, y produce descamacin de la piel. Esta informacin no tiene Theme park manager el consejo del mdico. Asegrese de hacerle al mdico cualquier pregunta que tenga. Document Released: 07/25/2005 Document Revised: 02/26/2017 Document  Reviewed: 03/02/2015 Elsevier Interactive Patient Education  2018 [employer].      IF you received an x-ray today, you will receive an invoice from [facility]. Please contact [facility] at [phone] with questions or concerns regarding your invoice.   IF you received labwork today, you will receive an invoice from [facility]. Please contact [facility] at  [phone] with questions or concerns regarding your invoice.   Our billing staff will not be able to assist you with questions regarding bills from these companies.  You will be contacted with the lab results as soon as they are available. The fastest way to get your results is to activate your My Chart account. Instructions are located on the last page of this paperwork. If you have not heard from [place] regarding the results in 2 weeks, please contact this office.

## 2018-03-10 NOTE — Progress Notes (Signed)
Subjective:  By signing my name below, I, Stann Ore, attest that this documentation has been prepared under the direction and in the presence of Meredith Staggers, MD. Electronically Signed: Stann Ore, Scribe. 03/10/2018 , 11:31 AM .  Patient was seen in Room 10 .   Patient ID: Susan Freeman, female    DOB: 12-17-1990, 27 y.o.   MRN: [id number] Chief Complaint  Patient presents with  . dicoloration of skin    on right hip(last week) and right butt cheek(1 month)   HPI Susan Freeman is a 27 y.o. female  Here for rash over her right hip and right buttocks. Patient informs noticing rash over her buttocks about a month ago and area over her right hip about 1 week ago. She describes affected areas as white appearing rash, without involvement in other areas. She's tried applying hydrocortisone cream over the affected areas. She's had some itching over the buttocks area, but not constant. She denies blisters or water bumps. She denies pain with the rash.     Patient's primary language is Spanish, but is able to understand some [place].   Patient Active Problem List   Diagnosis Date Noted  . Acute back pain 12/24/2017  . Dysuria 12/24/2017   No past medical history on file. No past surgical history on file. No Known Allergies Prior to Admission medications   Not on File   Social History   Socioeconomic History  . Marital status: Significant Other    Spouse name: Not on file  . Number of children: Not on file  . Years of education: Not on file  . Highest education level: Not on file  Occupational History  . Not on file  Social Needs  . Financial resource strain: Not on file  . Food insecurity:    Worry: Not on file    Inability: Not on file  . Transportation needs:    Medical: Not on file    Non-medical: Not on file  Tobacco Use  . Smoking status: Never Smoker  . Smokeless tobacco: Never Used  Substance and Sexual Activity  . Alcohol use: No  .  Drug use: No  . Sexual activity: Yes    Birth control/protection: IUD  Lifestyle  . Physical activity:    Days per week: Not on file    Minutes per session: Not on file  . Stress: Not on file  Relationships  . Social connections:    Talks on phone: Not on file    Gets together: Not on file    Attends religious service: Not on file    Active member of club or organization: Not on file    Attends meetings of clubs or organizations: Not on file    Relationship status: Not on file  . Intimate partner violence:    Fear of current or ex partner: Not on file    Emotionally abused: Not on file    Physically abused: Not on file    Forced sexual activity: Not on file  Other Topics Concern  . Not on file  Social History Narrative  . Not on file   Review of Systems  Constitutional: Negative for chills, fatigue, fever and unexpected weight change.  Respiratory: Negative for cough.   Gastrointestinal: Negative for constipation, diarrhea, nausea and vomiting.  Musculoskeletal: Negative for myalgias.  Skin: Positive for color change and rash. Negative for wound.  Neurological: Negative for dizziness, weakness and headaches.       Objective:   Physical  Exam  Constitutional: She is oriented to person, place, and time. She appears well-developed and well-nourished. No distress.  HENT:  Head: Normocephalic and atraumatic.  Eyes: Pupils are equal, round, and reactive to light. EOM are normal.  Neck: Neck supple.  Cardiovascular: Normal rate.  Pulmonary/Chest: Effort normal. No respiratory distress.  Musculoskeletal: Normal range of motion.  Neurological: She is alert and oriented to person, place, and time.  Skin: Skin is warm and dry.  Right hip: there is a patch of hypopigmentation over right hip over the ASIS, measuring about 3cm x 1cm, no induration, no surrounding erythema Right buttock: slight hypopigmentation upper cluneal cleft measuring about 4cm x 2cm, and a patch over right  buttock near the anus 1cm x 2cm and left buttock 1cm x 3cm; no surrounding vesicles Left hip: small 1cm area over left hip No rash seen on her back  Psychiatric: She has a normal mood and affect. Her behavior is normal.  Nursing note and vitals reviewed.   Vitals:   03/10/18 1104  BP: 116/68  Pulse: 76  Temp: 98.2 F (36.8 C)  TempSrc: Oral  SpO2: 98%  Weight: 134 lb 12.8 oz (61.1 kg)  Height:  (1.473 m)       Assessment & Plan:   Susan Freeman is a 27 y.o. female Rash and nonspecific skin eruption - Plan: clotrimazole (LOTRIMIN) 1 % cream, Ambulatory referral to Dermatology Hypopigmentation - Plan: Ambulatory referral to Dermatology  -Based on appearance, differential includes tinea, but appears to be more consistent with vitiligo.  No known family history of same.  Can try initial approach with clotrimazole topical, but will refer to dermatology for further evaluation, especially if this is vitiligo as may recommend other treatment.  Meds ordered this encounter  Medications  . clotrimazole (LOTRIMIN) 1 % cream    Sig: Apply 1 application topically 2 (two) times daily.    Dispense:  30 g    Refill:  0   Patient Instructions   Use crema a areas 2 veces cada dia. si no mejor en 2-3 semanas,voy a referir a specialista. regrese mas temprano si rancha en otro areas or peor.    Erupcin cutnea Rash Una erupcin cutnea es un cambio en el color de la piel. Una erupcin tambin puede cambiar la forma en que se siente la piel. Hay muchas afecciones y [employer] que pueden causar una erupcin. Siga estas instrucciones en su casa: Est atento a cualquier cambio en los sntomas. Estas indicaciones pueden ayudarlo con el trastorno: [employer] o aplquese los medicamentos de venta libre y Building control surveyor como se lo haya indicado el mdico. Estos pueden incluir lo siguiente:  Crema con corticoides.  Lociones para Location manager.  Antihistamnicos por va oral.  Cuidado de la piel  Aplique compresas fras en las zonas afectadas.  Trate de tomar un bao con lo siguiente: ? Sales de [employer]. Siga las instrucciones del envase. Puede conseguirlas en la tienda de comestibles o la farmacia local. ? Bicarbonato de sodio. Vierta un poco en la baera como se lo haya indicado el mdico. ? Avena coloidal. Siga las instrucciones del envase. Puede conseguirla en la tienda de comestibles o la farmacia local.  Intente colocarse una pasta de bicarbonato de sodio sobre la piel. Agregue agua al bicarbonato hasta que tenga la consistencia de una pasta.  No se rasque ni se refriegue la piel.  Evite cubrir la erupcin. Asegrese de que la erupcin est expuesta al aire todo  lo posible. Instrucciones generales  Evite los baos de inmersin y las duchas calientes ya que pueden empeorar la picazn. Neomia Dear ducha fra puede Acupuncturist.  Evite los detergentes y los jabones perfumados, y los perfumes. Utilice jabones, detergentes, perfumes y cosmticos suaves.  Evite las sustancias que causan la erupcin. Lleve un diario como ayuda para registrar lo que le causa erupcin. Escriba los siguientes datos: ? Lo que come. ? Los cosmticos que [place]. ? Lo que bebe. ? La ropa que [place]. Esto incluye las alhajas.  Concurra a todas las visitas de control como se lo haya indicado el mdico. Esto es importante. Comunquese con un mdico si:  Transpira de noche.  Pierde peso.  Orina ms de lo normal.  Se siente dbil.  Vomita.  Tiene un color amarillo en la piel o en la zona blanca del ojo (ictericia).  La piel: ? Siente hormigueos. ? Se adormece.  La erupcin cutnea: ? No desaparece despus de [employer]. ? Empeora.  Usted: ? Est inusualmente sediento. ? Est ms cansado que lo habitual.  Tiene los siguientes sntomas: ? Sntomas nuevos. ? Dolor en el abdomen. ? Fiebre. ? Diarrea. Solicite ayuda de  inmediato si:  Presenta una erupcin que cubre todo el cuerpo o la mayor parte de [place]. La erupcin puede ser dolorosa o no.  Tiene ampollas que tienen las siguientes caractersticas: ? Se ubican sobre la erupcin. ? Se agrandan o crecen juntas. ? Estn doloridos. ? Estn dentro de la nariz o la boca.  Le aparece una erupcin que tiene las siguientes caractersticas: ? Tiene pequeas manchas moradas, como si fueran pinchazos, en todo el cuerpo. ? Tiene un aspecto parecido a Racheal Patches o a un blanco de tiro. ? No est relacionada con una exposicin al sol, est enrojecida y duele, y produce descamacin de la piel. Esta informacin no tiene Theme park manager el consejo del mdico. Asegrese de hacerle al mdico cualquier pregunta que tenga. Document Released: 07/25/2005 Document Revised: 02/26/2017 Document Reviewed: 03/02/2015 Elsevier Interactive Patient Education  2018 [employer].      IF you received an x-ray today, you will receive an invoice from [facility]. Please contact [facility] at [phone] with questions or concerns regarding your invoice.   IF you received labwork today, you will receive an invoice from [facility]. Please contact [facility] at [phone] with questions or concerns regarding your invoice.   Our billing staff will not be able to assist you with questions regarding bills from these companies.  You will be contacted with the lab results as soon as they are available. The fastest way to get your results is to activate your My Chart account. Instructions are located on the last page of this paperwork. If you have not heard from [place] regarding the results in 2 weeks, please contact this office.       I personally performed the services described in this documentation, which was scribed in my presence. The recorded information has been reviewed and considered for accuracy and completeness, addended by me as needed, and agree with  information above.  Signed,   Meredith Staggers, MD [facility] at [facility].  03/12/18 1:21 PM

## 2018-03-12 ENCOUNTER — Telehealth: Payer: Self-pay

## 2018-03-12 NOTE — Telephone Encounter (Signed)
Pt advised that it can take up to a week for referral to be complete.

## 2018-03-12 NOTE — Telephone Encounter (Signed)
Copied from CRM [id number]. Topic: Referral - Request >> Mar 12, 2018  9:18 AM Eston Mould B wrote: Reason for CRM:  PT is requesting referral to dermatologist for areas on skin the cream Dr Chilton Si prescribed is not working

## 2018-03-14 ENCOUNTER — Encounter: Payer: Self-pay | Admitting: Family Medicine

## 2018-03-14 ENCOUNTER — Ambulatory Visit: Payer: Self-pay | Admitting: Family Medicine

## 2018-03-14 VITALS — BP 110/80 | HR 79 | Temp 98.6°F | Ht <= 58 in | Wt 135.0 lb

## 2018-03-14 DIAGNOSIS — L299 Pruritus, unspecified: Secondary | ICD-10-CM

## 2018-03-14 DIAGNOSIS — Z872 Personal history of diseases of the skin and subcutaneous tissue: Secondary | ICD-10-CM

## 2018-03-14 DIAGNOSIS — F439 Reaction to severe stress, unspecified: Secondary | ICD-10-CM

## 2018-03-14 MED ORDER — HYDROXYZINE HCL 10 MG PO TABS
5.0000 mg | ORAL_TABLET | Freq: Three times a day (TID) | ORAL | 0 refills | Status: DC | PRN
Start: 2018-03-14 — End: 2020-09-02

## 2018-03-14 NOTE — Progress Notes (Signed)
Subjective:  By signing my name below, I, Susan Freeman, attest that this documentation has been prepared under the direction and in the presence of [name], MD. Electronically Signed: Moises Freeman, [place]. 03/14/2018 , 12:44 PM .  Patient was seen in Room 10 .   Patient ID: Susan Freeman, female    DOB: February 06, 1991, 27 y.o.   MRN: [id number] Chief Complaint  Patient presents with  . no energy     dermatology referral f/u and wants to know if her Freeman test are normal.    HPI Susan Freeman is a 27 y.o. female  Patient was seen 4 days ago for areas of hypopigmentation. I did prescribe clotrimazole topical for possible tinea, but differential included vitiligo, so referred to dermatology. Patient states her rash isn't as vibrant anymore, but still present. She is still waiting for dermatology referral, as she hasn't received a call from them yet.   Hand dryness She noticed dryness and burning over the top of her hands bilaterally. She denies burning, numbness or tingling in other areas. She states the burning in her hands started about 2 days ago, after worrying more about her rash. She's been applying lotion over the affected area. She denies breast feeding.   Patient has been feeling sad due to concern of the rash, as well the dryness and burning over her hands. She denies history of depression or anxiety. She denies SI/HI. She had little to no stress prior to rash appearing.   Language Barrier Patient's primary language is Spanish; she has an interpreter present in room to translate.   Patient Active Problem List   Diagnosis Date Noted  . Acute back pain 12/24/2017  . Dysuria 12/24/2017   No past medical history on file. No past surgical history on file. No Known Allergies Prior to Admission medications   Medication Sig Start Date End Date Taking? Authorizing Provider  clotrimazole (LOTRIMIN) 1 % cream Apply 1 application topically 2 (two) times daily.  03/10/18   Wendie Agreste, MD   Social History   Socioeconomic History  . Marital status: Significant Other    Spouse name: Not on file  . Number of children: Not on file  . Years of education: Not on file  . Highest education level: Not on file  Occupational History  . Not on file  Social Needs  . Financial resource strain: Not on file  . Food insecurity:    Worry: Not on file    Inability: Not on file  . Transportation needs:    Medical: Not on file    Non-medical: Not on file  Tobacco Use  . Smoking status: Never Smoker  . Smokeless tobacco: Never Used  Substance and Sexual Activity  . Alcohol use: No  . Drug use: No  . Sexual activity: Yes    Birth control/protection: IUD  Lifestyle  . Physical activity:    Days per week: Not on file    Minutes per session: Not on file  . Stress: Not on file  Relationships  . Social connections:    Talks on phone: Not on file    Gets together: Not on file    Attends religious service: Not on file    Active member of club or organization: Not on file    Attends meetings of clubs or organizations: Not on file    Relationship status: Not on file  . Intimate partner violence:    Fear of current or ex partner: Not on file  Emotionally abused: Not on file    Physically abused: Not on file    Forced sexual activity: Not on file  Other Topics Concern  . Not on file  Social History Narrative  . Not on file   Review of Systems  Constitutional: Negative for chills, fatigue, fever and unexpected weight change.  Respiratory: Negative for cough.   Gastrointestinal: Negative for constipation, diarrhea, nausea and vomiting.  Musculoskeletal: Positive for myalgias (bilateral hands).  Skin: Positive for rash (improving). Negative for wound.  Neurological: Negative for dizziness, weakness, numbness and headaches.  Psychiatric/Behavioral: Negative for self-injury and suicidal ideas. The patient is nervous/anxious.        Objective:     Physical Exam  Constitutional: She is oriented to person, place, and time. She appears well-developed and well-nourished. No distress.  HENT:  Head: Normocephalic and atraumatic.  Eyes: Pupils are equal, round, and reactive to light. EOM are normal.  Neck: Neck supple.  Cardiovascular: Normal rate.  Pulmonary/Chest: Effort normal. No respiratory distress.  Musculoskeletal: Normal range of motion.  Neurological: She is alert and oriented to person, place, and time.  Skin: Skin is warm and dry.  Bilateral hands: cap refill <1 second at finger tips, fingers warm, radial pulses normal, skin intact  Psychiatric: She has a normal mood and affect. Her behavior is normal.  Nursing note and vitals reviewed.   Vitals:   03/14/18 1217  BP: 110/80  Pulse: 79  Temp: 98.6 F (37 C)  TempSrc: Oral  SpO2: 97%  Weight: 135 lb (61.2 kg)  Height: 4' 10"  (1.473 m)       Assessment & Plan:    [name] is a 27 y.o. female History of skin hypopigmentation Okay to continue clotrimazole as recommended last visit, dermatology eval pending.  Counseled again on potential causes of hypopigmentation and not definitively vitiligo at this time.  Situational stress - Plan: hydrOXYzine (ATARAX/VISTARIL) 10 MG tablet Pruritic condition - Plan: hydrOXYzine (ATARAX/VISTARIL) 10 MG tablet  -Pruritus appeared to start after discussion of possible vitiligo and situational stress with this discussion.  She was tearful one point tenderness visit, denied any other stressors denies depression as above we discussed that dermatology may be able to provide more information and not definitively vitiligo at this time..  Handout given on stress and stress management, but follow-up discussed if any persistent depressive symptoms or significant anxiety.  -Trial of low-dose hydroxyzine for pruritus in hands and that may also help with situational anxiety.  Potential side effects discussed Interpreter present  with understanding expressed  Meds ordered this encounter  Medications  . hydrOXYzine (ATARAX/VISTARIL) 10 MG tablet    Sig: Take 0.5-2 tablets (5-20 mg total) by mouth 3 (three) times daily as needed. Label in spanish    Dispense:  30 tablet    Refill:  0   Patient Instructions   si no recibe contacto de specialista de piel en dos semanas, llamame. Use crema 2 veces al dia  Hydroxyzine si necesario por simptomas en manos. regrese en 1-2 semanas si no estan mejor.    Estrs y control del estrs (Stress and Stress Management) El estrs es una reaccin normal a los sucesos de la vida. Es lo que se siente cuando la vida le exige ms de lo que est acostumbrado o ms de lo que puede [place]. Un poco de [employer] ser til. Por ejemplo, la reaccin al estrs puede ayudarlo a tomar el ltimo autobs del da, a estudiar para un examen o  a cumplir con un plazo lmite en el trabajo. Sin embargo, el Retail buyer frecuente o que dura mucho tiempo puede causar problemas. Puede afectar la salud emocional y [place] y las actividades cotidianas normales. El exceso de estrs puede Academic librarian sistema inmunitario y aumentar el riesgo de que tenga enfermedades fsicas. Si ya tiene un problema mdico, el estrs puede empeorarlo. CAUSAS [employer] sucesos de la vida pueden causar estrs. Un suceso que le causa estrs a una persona puede no ser estresante para Theatre manager. Generalmente, los sucesos importantes de la vida causan estrs. Estos pueden ser positivos o negativos, por ejemplo, quedarse sin empleo, mudarse a una casa nueva, casarse, tener un beb o perder a un ser querido. Los sucesos de la vida menos evidentes tambin pueden causar estrs, especialmente si ocurren da tras da o en combinacin. Por ejemplo, trabajar muchas horas, conducir cuando hay mucho trnsito, cuidar a los nios, tener deudas o tener una relacin difcil. SIGNOS Y SNTOMAS El estrs puede causar sntomas emocionales que  incluyen lo siguiente:  Ansiedad. Sentirse preocupado, temeroso, nervioso, abrumado o fuera de control.  Enojo. Sentirse irritado o impaciente.  Depresin. Sentirse triste, decado, desesperanzado o culpable.  Dificultad para concentrarse, recordar o tomar decisiones. El estrs puede causar sntomas fsicos que incluyen lo siguiente:  Dolores y [place]. Estos pueden afectar la cabeza, el cuello, la espalda, el estmago u otras zonas del cuerpo.  Rigidez muscular o tensin mandibular.  Poca energa o dificultad para dormir. El estrs puede provocar conductas poco saludables, que incluyen lo siguiente:  Comer para sentirse mejor (comer en exceso) o IT consultant comidas.  Dormir [employer], mucho o ambas cosas.  Trabajar mucho o postergar tareas (posponer).  Fumar, beber alcohol o consumir drogas para sentirse mejor. DIAGNSTICO El estrs se diagnostica a travs de una evaluacin que realiza el mdico. El mdico le har preguntas sobre los sntomas o cualquier suceso estresante de la vida. Adems, el mdico le har preguntas sobre sus antecedentes mdicos y tal vez indique anlisis de [place] u otros estudios. Ciertas afecciones y algunos medicamentos pueden provocar sntomas fsicos similares a los del estrs. Las enfermedades mentales pueden causar sntomas emocionales y Actor conductas poco sanas similares a los del estrs. El mdico podr derivarlo a un profesional de la salud mental para que le realice una evaluacin ms profunda. TRATAMIENTO El tratamiento recomendado para el estrs es el control del estrs. Los [employer] del control del estrs son reducir los eventos estresantes de la vida y enfrentar el estrs de maneras saludables. Las tcnicas para reducir los eventos estresantes de la vida incluyen lo siguiente:  Identificacin del estrs. Autocontrolar el estrs e identificar qu lo causa. Estas habilidades pueden ayudarlo a evitar algunos sucesos estresantes.  Control del  tiempo. Establecer las prioridades, llevar un calendario de los sucesos y aprender a Software engineer "no". Estas herramientas pueden ayudarlo a evitar que asuma muchos compromisos. Las tcnicas para lidiar con el estrs incluyen lo siguiente:  Replantearse el problema. Tratar de pensar de un modo realista los eventos estresantes en lugar de ignorarlos o Horticulturist, commercial. Intente encontrar los aspectos positivos en una situacin estresante, en lugar de enfocarse en los negativos.  La actividad fsica. El ejercicio fsico puede liberar la tensin fsica y Architectural technologist. La clave es encontrar un tipo de ejercicio fsico que disfrute y practique con regularidad.  Tcnicas de relajacin. Estas relajan el cuerpo y la [place]. Los ejemplos son el yoga, la meditacin, el tai chi, la biorregulacin, la respiracin  profunda, la relajacin muscular progresiva, escuchar msica, estar al Alexis Goodell en contacto con la naturaleza, llevar un diario y otros pasatiempos. Nuevamente, la clave es encontrar una o ms opciones que disfrute y Hydrographic surveyor con regularidad.  Estilo de vida saludable. Coma una dieta equilibrada, duerma mucho y no fume. Evite el consumo de alcohol o de drogas para relajarse.  Red de apoyo slida. Pase tiempo con la familia, los amigos y otras personas cuya compaa disfrute. Exprese sus sentimientos y converse acerca de las cosas que le preocupan con alguien en quien confe. La orientacin o la psicoterapiacon un profesional de la salud mental pueden ser de ayuda si tiene dificultades para controlar el estrs por s solo. Generalmente, no se recomiendan medicamentos para el tratamiento del estrs. Hable con el mdico si considera que necesita medicamentos para los sntomas de estrs. INSTRUCCIONES PARA EL CUIDADO EN EL HOGAR  Concurra a todas las visitas de control como se lo haya indicado el mdico.  Tome todos los medicamentos como se lo haya indicado el mdico.  SOLICITE ATENCIN MDICA  SI:  Los sntomas empeoran o empieza a tener sntomas nuevos.  Se siente abrumado por los problemas y ya no puede manejarlos solo.  SOLICITE ATENCIN MDICA DE INMEDIATO SI:  Siente deseos de lastimarse o lastimar a Nurse, children's.  Esta informacin no tiene Marine scientist el consejo del mdico. Asegrese de hacerle al mdico cualquier pregunta que tenga. Document Released: 07/25/2005 Document Revised: 02/06/2016 Document Reviewed: 06/09/2013 Elsevier Interactive Patient Education  2017 [place] (Pruritus) El prurito es la sensacin de picazn. Hay muchas afecciones y factores diferentes que pueden causar picazn en la piel. La piel seca es una de las causas ms frecuentes de picazn. La mayora de las causas de picazn no requieren [place]. La picazn en la piel puede convertirse en erupcin cutnea. INSTRUCCIONES PARA EL CUIDADO EN EL HOGAR Controle el prurito para Actuary cambio. Siga estos pasos para controlar la afeccin: Cuidado de la piel  Humctese la piel segn sea necesario. Un humectante con vaselina es lo ms adecuado para mantener la humedad de la piel.  Tome o aplquese los medicamentos solamente como se lo haya indicado el mdico. Esto puede incluir lo siguiente: ? [place] Records con corticoides. ? Lociones para Barrister's clerk. ? Antihistamnicos orales.  Aplique compresas fras en las zonas afectadas.  Trate de tomar un bao con lo siguiente: ? Sales de [place]. Siga las instrucciones del envase. Puede conseguirlas en la tienda de comestibles o la farmacia local. ? Bicarbonato de sodio. Vierta un poco en la baera como se lo haya indicado el mdico. ? Avena coloidal. Siga las instrucciones del envase. Puede conseguirla en la tienda de comestibles o la farmacia local.  Intente colocarse una pasta de bicarbonato de sodio sobre la piel. Agregue agua al bicarbonato de sodio y revuelva hasta alcanzar la consistencia de una pasta.  No se  rasque la piel.  Evite los baos de inmersin y las duchas calientes, que pueden Technical brewer. La ducha fra puede aliviar la picazn siempre que despus use un humectante.  Evite los detergentes y los jabones perfumados, y los perfumes. Utilice jabones, detergentes, perfumes y cosmticos suaves. Instrucciones generales  Evite usar ropa ajustada.  Lleve un diario como ayuda para registrar lo que le causa picazn. Escriba los siguientes datos: ? Lo que come. ? Los cosmticos que [place]. ? Lo que bebe. ? La ropa que [place]. [place] alhajas.  Use un  humidificador. Este [employer] humedad del aire, lo que ayuda a Risk analyst. SOLICITE ATENCIN MDICA SI:  La picazn no desaparece despus de [employer].  Transpira de noche.  [place].  Tiene una sed inusual.  Orina ms de lo normal.  Est ms cansado que lo habitual.  Siente dolor abdominal.  Siente hormigueos en la piel.  Se siente dbil.  Tiene un color amarillo en la piel o en la zona blanca del ojo (ictericia).  Siente la piel entumecida. Esta informacin no tiene Marine scientist el consejo del mdico. Asegrese de hacerle al mdico cualquier pregunta que tenga. Document Released: 06/27/2011 Document Revised: 03/01/2015 Document Reviewed: 10/11/2014 Elsevier Interactive Patient Education  2018 [employer].   IF you received an x-ray today, you will receive an invoice from [facility]. Please contact [facility] at [phone] with questions or concerns regarding your invoice.   IF you received labwork today, you will receive an invoice from [place]. Please contact [facility] at [phone] with questions or concerns regarding your invoice.   Our billing staff will not be able to assist you with questions regarding bills from these companies.  You will be contacted with the lab results as soon as they are available. The fastest way to get your results is to activate  your My Chart account. Instructions are located on the last page of this paperwork. If you have not heard from [place] regarding the results in 2 weeks, please contact this office.       I personally performed the services described in this documentation, which was scribed in my presence. The recorded information has been reviewed and considered for accuracy and completeness, addended by me as needed, and agree with information above.  Signed,   [name], MD [facility] [place].  03/15/18 2:47 PM

## 2018-03-14 NOTE — Patient Instructions (Addendum)
si no recibe contacto de specialista de piel en dos semanas, llamame. Use crema 2 veces al dia  Hydroxyzine si necesario por simptomas en manos. regrese en 1-2 semanas si no estan mejor.    Estrs y control del estrs (Stress and Stress Management) El estrs es una reaccin normal a los sucesos de la vida. Es lo que se siente cuando la vida le exige ms de lo que est acostumbrado o ms de lo que puede [place]. Un poco de Energy East Corporation ser til. Por ejemplo, la reaccin al estrs puede ayudarlo a tomar el ltimo autobs del da, a estudiar para un examen o a cumplir con un plazo lmite en el trabajo. Sin embargo, el Retail buyer frecuente o que dura mucho tiempo puede causar problemas. Puede afectar la salud emocional y [place] y las actividades cotidianas normales. El exceso de estrs puede Academic librarian sistema inmunitario y aumentar el riesgo de que tenga enfermedades fsicas. Si ya tiene un problema mdico, el estrs puede empeorarlo. CAUSAS The Mutual of Omaha sucesos de la vida pueden causar estrs. Un suceso que le causa estrs a una persona puede no ser estresante para Theatre manager. Generalmente, los sucesos importantes de la vida causan estrs. Estos pueden ser positivos o negativos, por ejemplo, quedarse sin empleo, mudarse a una casa nueva, casarse, tener un beb o perder a un ser querido. Los sucesos de la vida menos evidentes tambin pueden causar estrs, especialmente si ocurren da tras da o en combinacin. Por ejemplo, trabajar muchas horas, conducir cuando hay mucho trnsito, cuidar a los nios, tener deudas o tener una relacin difcil. SIGNOS Y SNTOMAS El estrs puede causar sntomas emocionales que incluyen lo siguiente:  Ansiedad. Sentirse preocupado, temeroso, nervioso, abrumado o fuera de control.  Enojo. Sentirse irritado o impaciente.  Depresin. Sentirse triste, decado, desesperanzado o culpable.  Dificultad para concentrarse, recordar o tomar decisiones. El estrs puede  causar sntomas fsicos que incluyen lo siguiente:  Dolores y [place]. Estos pueden afectar la cabeza, el cuello, la espalda, el estmago u otras zonas del cuerpo.  Rigidez muscular o tensin mandibular.  Poca energa o dificultad para dormir. El estrs puede provocar conductas poco saludables, que incluyen lo siguiente:  Comer para sentirse mejor (comer en exceso) o IT consultant comidas.  Dormir Dow Chemical, mucho o ambas cosas.  Trabajar mucho o postergar tareas (posponer).  Fumar, beber alcohol o consumir drogas para sentirse mejor. DIAGNSTICO El estrs se diagnostica a travs de una evaluacin que realiza el mdico. El mdico le har preguntas sobre los sntomas o cualquier suceso estresante de la vida. Adems, el mdico le har preguntas sobre sus antecedentes mdicos y tal vez indique anlisis de [place] u otros estudios. Ciertas afecciones y algunos medicamentos pueden provocar sntomas fsicos similares a los del estrs. Las enfermedades mentales pueden causar sntomas emocionales y Actor conductas poco sanas similares a los del estrs. El mdico podr derivarlo a un profesional de la salud mental para que le realice una evaluacin ms profunda. TRATAMIENTO El tratamiento recomendado para el estrs es el control del estrs. Los Berkshire Hathaway del control del estrs son reducir los eventos estresantes de la vida y enfrentar el estrs de maneras saludables. Las tcnicas para reducir los eventos estresantes de la vida incluyen lo siguiente:  Identificacin del estrs. Autocontrolar el estrs e identificar qu lo causa. Estas habilidades pueden ayudarlo a evitar algunos sucesos estresantes.  Control del tiempo. Establecer las prioridades, llevar un calendario de los sucesos y aprender a Software engineer "no". Estas herramientas pueden ayudarlo  a evitar que asuma muchos compromisos. Las tcnicas para lidiar con el estrs incluyen lo siguiente:  Replantearse el problema. Tratar de pensar de un modo  realista los eventos estresantes en lugar de ignorarlos o Horticulturist, commercial. Intente encontrar los aspectos positivos en una situacin estresante, en lugar de enfocarse en los negativos.  La actividad fsica. El ejercicio fsico puede liberar la tensin fsica y Architectural technologist. La clave es encontrar un tipo de ejercicio fsico que disfrute y practique con regularidad.  Tcnicas de relajacin. Estas relajan el cuerpo y la [place]. Los ejemplos son el yoga, la meditacin, el tai chi, la biorregulacin, la respiracin profunda, la relajacin muscular progresiva, Conservation officer, nature, estar al Auto-Owners Insurance en contacto con la naturaleza, llevar un diario y otros pasatiempos. Nuevamente, la clave es encontrar una o ms opciones que disfrute y Hydrographic surveyor con regularidad.  Estilo de vida saludable. Coma una dieta equilibrada, duerma mucho y no fume. Evite el consumo de alcohol o de drogas para relajarse.  Red de apoyo slida. Pase tiempo con la familia, los amigos y otras personas cuya compaa disfrute. Exprese sus sentimientos y converse acerca de las cosas que le preocupan con alguien en quien confe. La orientacin o la psicoterapiacon un profesional de la salud mental pueden ser de ayuda si tiene dificultades para controlar el estrs por s solo. Generalmente, no se recomiendan medicamentos para el tratamiento del estrs. Hable con el mdico si considera que necesita medicamentos para los sntomas de estrs. INSTRUCCIONES PARA EL CUIDADO EN EL HOGAR  Concurra a todas las visitas de control como se lo haya indicado el mdico.  Tome todos los medicamentos como se lo haya indicado el mdico.  SOLICITE ATENCIN MDICA SI:  Los sntomas empeoran o empieza a tener sntomas nuevos.  Se siente abrumado por los problemas y ya no puede manejarlos solo.  SOLICITE ATENCIN MDICA DE INMEDIATO SI:  Siente deseos de lastimarse o lastimar a Nurse, children's.  Esta informacin no tiene Marine scientist el  consejo del mdico. Asegrese de hacerle al mdico cualquier pregunta que tenga. Document Released: 07/25/2005 Document Revised: 02/06/2016 Document Reviewed: 06/09/2013 Elsevier Interactive Patient Education  2017 [place] ([place]) El prurito es la sensacin de picazn. Hay muchas afecciones y factores diferentes que pueden causar picazn en la piel. La piel seca es una de las causas ms frecuentes de picazn. La mayora de las causas de picazn no requieren [place]. La picazn en la piel puede convertirse en erupcin cutnea. INSTRUCCIONES PARA EL CUIDADO EN EL HOGAR Controle el prurito para Actuary cambio. Siga estos pasos para controlar la afeccin: Cuidado de la piel  Humctese la piel segn sea necesario. Un humectante con vaselina es lo ms adecuado para mantener la humedad de la piel.  Tome o aplquese los medicamentos solamente como se lo haya indicado el mdico. Esto puede incluir lo siguiente: ? [place] Records con corticoides. ? Lociones para Barrister's clerk. ? Antihistamnicos orales.  Aplique compresas fras en las zonas afectadas.  Trate de tomar un bao con lo siguiente: ? Sales de [place]. Siga las instrucciones del envase. Puede conseguirlas en la tienda de comestibles o la farmacia local. ? Bicarbonato de sodio. Vierta un poco en la baera como se lo haya indicado el mdico. ? Avena coloidal. Siga las instrucciones del envase. Puede conseguirla en la tienda de comestibles o la farmacia local.  Intente colocarse una pasta de bicarbonato de sodio sobre la piel. Agregue agua al bicarbonato de sodio y [place]  hasta alcanzar la consistencia de una pasta.  No se rasque la piel.  Evite los baos de inmersin y las duchas calientes, que pueden Technical brewer. La ducha fra puede aliviar la picazn siempre que despus use un humectante.  Evite los detergentes y los jabones perfumados, y los perfumes. Utilice jabones, detergentes, perfumes y  cosmticos suaves. Instrucciones generales  Evite usar ropa ajustada.  Lleve un diario como ayuda para registrar lo que le causa picazn. Escriba los siguientes datos: ? Lo que come. ? Los cosmticos que [place]. ? Lo que bebe. ? La ropa que [place]. [place] alhajas.  Use un humidificador. Este The Interpublic Group [employer] Companies humedad del aire, lo que ayuda a Risk analyst. SOLICITE ATENCIN MDICA SI:  La picazn no desaparece despus de Unisys Corporation.  Transpira de noche.  [place].  Tiene una sed inusual.  Orina ms de lo normal.  Est ms cansado que lo habitual.  Siente dolor abdominal.  Siente hormigueos en la piel.  Se siente dbil.  Tiene un color amarillo en la piel o en la zona blanca del ojo (ictericia).  Siente la piel entumecida. Esta informacin no tiene Marine scientist el consejo del mdico. Asegrese de hacerle al mdico cualquier pregunta que tenga. Document Released: 06/27/2011 Document Revised: 03/01/2015 Document Reviewed: 10/11/2014 Elsevier Interactive Patient Education  2018 Reynolds American.   IF you received an x-ray today, you will receive an invoice from [facility]. Please contact [facility] at [phone] with questions or concerns regarding your invoice.   IF you received labwork today, you will receive an invoice from [facility]. Please contact [facility] at [phone] with questions or concerns regarding your invoice.   Our billing staff will not be able to assist you with questions regarding bills from these companies.  You will be contacted with the lab results as soon as they are available. The fastest way to get your results is to activate your My Chart account. Instructions are located on the last page of this paperwork. If you have not heard from [place] regarding the results in 2 weeks, please contact this office.

## 2018-03-15 ENCOUNTER — Encounter: Payer: Self-pay | Admitting: Family Medicine

## 2018-04-15 ENCOUNTER — Ambulatory Visit (INDEPENDENT_AMBULATORY_CARE_PROVIDER_SITE_OTHER): Payer: Self-pay | Admitting: Physician Assistant

## 2018-04-28 ENCOUNTER — Ambulatory Visit: Payer: Self-pay | Admitting: Family Medicine

## 2018-04-28 NOTE — Progress Notes (Deleted)
   Subjective   Patient ID: Susan HeaterMarisela Freeman    DOB: 01/14/1991, 27 y.o. female   MRN: [id number]  CC: "***"  HPI: Carole CivilMarisela Freeman is a 27 y.o. female who presents to clinic today for the following:  ***: ***  ROS: see HPI for pertinent.  PMFSH: None.  Surgical history unremarkable.  Family history unremarkable.. Smoking status reviewed. Medications reviewed.  Objective   There were no vitals taken for this visit. Vitals and nursing note reviewed.  General: well nourished, well developed, NAD with non-toxic appearance HEENT: normocephalic, atraumatic, moist mucous membranes Neck: supple, non-tender without lymphadenopathy Cardiovascular: regular rate and rhythm without murmurs, rubs, or gallops Lungs: clear to auscultation bilaterally with normal work of breathing Abdomen: soft, non-tender, non-distended, normoactive bowel sounds Skin: warm, dry, no rashes or lesions, cap refill < 2 seconds Extremities: warm and well perfused, normal tone, no edema  Assessment & Plan   No problem-specific Assessment & Plan notes found for this encounter.  No orders of the defined types were placed in this encounter.  No orders of the defined types were placed in this encounter.   [name] [name], DO [facility], PGY-2 04/28/2018, 9:28 AM

## 2020-02-24 ENCOUNTER — Ambulatory Visit: Payer: Self-pay | Admitting: Family Medicine

## 2020-08-22 ENCOUNTER — Other Ambulatory Visit: Payer: Self-pay

## 2020-08-22 ENCOUNTER — Other Ambulatory Visit (INDEPENDENT_AMBULATORY_CARE_PROVIDER_SITE_OTHER): Payer: Self-pay

## 2020-08-22 DIAGNOSIS — Z3481 Encounter for supervision of other normal pregnancy, first trimester: Secondary | ICD-10-CM

## 2020-08-22 LAB — POCT URINALYSIS DIP (MANUAL ENTRY)
Bilirubin, UA: NEGATIVE
Glucose, UA: NEGATIVE mg/dL
Ketones, POC UA: NEGATIVE mg/dL
Leukocytes, UA: NEGATIVE
Nitrite, UA: NEGATIVE
Protein Ur, POC: NEGATIVE mg/dL
Spec Grav, UA: 1.02 (ref 1.010–1.025)
Urobilinogen, UA: 1 E.U./dL
pH, UA: 8 (ref 5.0–8.0)

## 2020-08-22 LAB — POCT UA - MICROSCOPIC ONLY

## 2020-08-23 LAB — HGB FRACTIONATION CASCADE

## 2020-08-23 LAB — HCV AB W REFLEX TO QUANT PCR: HCV Ab: 0.1 s/co ratio (ref 0.0–0.9)

## 2020-08-24 LAB — OBSTETRIC PANEL, INCLUDING HIV
Antibody Screen: NEGATIVE
Basophils Absolute: 0 10*3/uL (ref 0.0–0.2)
Basos: 1 %
EOS (ABSOLUTE): 0.2 10*3/uL (ref 0.0–0.4)
Eos: 2 %
HIV Screen 4th Generation wRfx: NONREACTIVE
Hematocrit: 37.4 % (ref 34.0–46.6)
Hemoglobin: 12.5 g/dL (ref 11.1–15.9)
Hepatitis B Surface Ag: NEGATIVE
Immature Grans (Abs): 0 10*3/uL (ref 0.0–0.1)
Immature Granulocytes: 0 %
Lymphocytes Absolute: 2.6 10*3/uL (ref 0.7–3.1)
Lymphs: 30 %
MCH: 29.4 pg (ref 26.6–33.0)
MCHC: 33.4 g/dL (ref 31.5–35.7)
MCV: 88 fL (ref 79–97)
Monocytes Absolute: 0.5 10*3/uL (ref 0.1–0.9)
Monocytes: 6 %
Neutrophils Absolute: 5.2 10*3/uL (ref 1.4–7.0)
Neutrophils: 61 %
Platelets: 261 10*3/uL (ref 150–450)
RBC: 4.25 x10E6/uL (ref 3.77–5.28)
RDW: 13.6 % (ref 11.7–15.4)
RPR Ser Ql: NONREACTIVE
Rh Factor: POSITIVE
Rubella Antibodies, IGG: 5.86 index (ref >0.99)
WBC: 8.6 10*3/uL (ref 3.4–10.8)

## 2020-08-24 LAB — HGB FRACTIONATION CASCADE
Hgb A2: 2.5 % (ref 1.8–3.2)
Hgb F: 0 % (ref 0.0–2.0)

## 2020-08-24 LAB — HCV INTERPRETATION

## 2020-08-24 LAB — CULTURE, OB URINE

## 2020-08-24 LAB — URINE CULTURE, OB REFLEX

## 2020-08-26 LAB — URINE CULTURE, OB REFLEX

## 2020-09-02 ENCOUNTER — Other Ambulatory Visit (HOSPITAL_COMMUNITY)
Admission: RE | Admit: 2020-09-02 | Discharge: 2020-09-02 | Disposition: A | Discharge status: Home / Self Care | Payer: Self-pay | Source: Ambulatory Visit | Attending: Family Medicine | Admitting: Family Medicine

## 2020-09-02 ENCOUNTER — Other Ambulatory Visit: Payer: Self-pay

## 2020-09-02 ENCOUNTER — Encounter: Payer: Self-pay | Admitting: Family Medicine

## 2020-09-02 ENCOUNTER — Ambulatory Visit (INDEPENDENT_AMBULATORY_CARE_PROVIDER_SITE_OTHER): Payer: Self-pay | Admitting: Family Medicine

## 2020-09-02 VITALS — BP 110/76 | HR 82 | Wt 141.8 lb

## 2020-09-02 DIAGNOSIS — Z3A11 11 weeks gestation of pregnancy: Secondary | ICD-10-CM

## 2020-09-02 DIAGNOSIS — Z124 Encounter for screening for malignant neoplasm of cervix: Secondary | ICD-10-CM

## 2020-09-02 DIAGNOSIS — R8271 Bacteriuria: Secondary | ICD-10-CM

## 2020-09-02 DIAGNOSIS — O99820 Streptococcus B carrier state complicating pregnancy: Secondary | ICD-10-CM | POA: Insufficient documentation

## 2020-09-02 DIAGNOSIS — Z113 Encounter for screening for infections with a predominantly sexual mode of transmission: Secondary | ICD-10-CM

## 2020-09-02 LAB — POCT WET PREP (WET MOUNT)
Clue Cells Wet Prep Whiff POC: NEGATIVE
Trichomonas Wet Prep HPF POC: ABSENT

## 2020-09-02 NOTE — Assessment & Plan Note (Signed)
  Nursing Staff Provider  Office Location  [facility]  Dating   LMP  Language   Spanish Anatomy US    Flu Vaccine   Genetic Screen  NIPS:   AFP:   First Screen:  Quad:    TDaP vaccine    Hgb A1C or  GTT Early  Third trimester   Rhogam     LAB RESULTS   Feeding Plan Breast Blood Type B/Positive/-- (10/25 1133)   Contraception IUD Antibody Negative (10/25 1133)  Circumcision Yes if boy Rubella 5.86 (10/25 1133)  Pediatrician  [facility] RPR Non Reactive (10/25 1133)   Support Person Miguel HBsAg Negative (10/25 1133)   Prenatal Classes  HIV Non Reactive (10/25 1133)  BTL Consent  GBS   Positive in Urine  VBAC Consent  Pap   Resident PCP   Hgb Electro  Negative  COVID Vax   CF   Hep C Antibody   SMA     Waterbirth  Refer to [facility] -> [ ]  Class [ ]  Consent [ ]  CNM visit

## 2020-09-02 NOTE — Progress Notes (Signed)
Patient Name: Susan Freeman Date of Birth: [birth year] [facility] Initial Prenatal Visit  Susan Freeman is a 29 y.o. year old G2P2002 at Unknown who presents for her initial prenatal visit. Pregnancy is planned She reports fatigue, nausea and positive home pregnancy test. She is taking a prenatal vitamin.  She denies pelvic pain or vaginal bleeding.   Pregnancy Dating:  The patient is dated by LMP at this time.   LMP: 06/12/2020  Period is certain:  Yes.   Periods were regular:  Yes.  LMP was a typical period:  Yes.   Using hormonal contraception in 3 months prior to conception: No  Lab Review:  Blood type: B  Rh Status: +  Antibody screen: Negative  HIV: Negative  RPR: Negative  Hemoglobin electrophoresis reviewed: Yes  Results of OB urine culture are: Positive for GBS  Rubella: Immune  Varicella status is Immune, per labs in 2016 (02/01/2015)  PMH: Reviewed and as detailed below:  HTN: No   Type 1 or 2 Diabetes: No   Depression:  No   Seizure disorder:  No  VTE: No ,   History of STI No,   Abnormal Pap smear:  No, last in 2017 NILM.  Genital herpes simplex:  No   PSH:  Gynecologic Surgery:  no  Surgical history reviewed, notable for: none  Obstetric History:  Obstetric history tab updated and reviewed.   Summary of prior pregnancies:    1st pregnancy: No complications.  Vaginal Delivery on 06/15/2013, 7lbs 1.1oz.  No delivery complications.  Child is healthy and doing well.  2nd pregnancy: No complications.  Vaginal Delivery on 05/04/2015, 8lbs 0.8oz.  No delivery complications.  Reports that she felt ill afterwards and thinks it was from epidural.  Child is healthy and doing well.    Cesarean delivery: No   Gestational Diabetes:  No  Hypertension in pregnancy: No  History of preterm birth: No  History of LGA/SGA infant:  No  History of shoulder dystocia: No  Indications for referral were  reviewed, and the patient has no obstetric indications for referral to High Risk OB Clinic at this time.   Social History:  Partner's name: Merry Lofty   Tobacco use: No  Alcohol use:  No  Other substance use:  No  Current Medications:   Prenatal vitamin   Reviewed and appropriate in pregnancy.   Genetic and Infection Screen:  Flow Sheet Updated Yes  Planning:   Does not desire an epidural, no waterbirth  Circumcision desired if boy  [facility] as pediatrician, other children come here  Breastfeeding  She would like an IUD for contraception  Prenatal Exam: Gen: Well nourished, well developed.  No distress.  Vitals noted. HEENT: Normocephalic, atraumatic.  Neck supple without cervical lymphadenopathy, thyromegaly or thyroid nodules.  Fair dentition. CV: RRR no murmur, gallops or rubs Lungs: CTA B.  Normal respiratory effort without wheezes or rales. Abd: soft, NTND. +BS.  Uterus not appreciated above pelvis. GU: Normal external female genitalia without lesions.  Nl vaginal, well rugated without lesions. No vaginal discharge.  Bimanual exam: No adnexal mass or TTP. No CMT.  Uterus size appropriate Ext: No clubbing, cyanosis or edema. Psych: Normal grooming and dress.  Not depressed or anxious appearing.  Normal thought content and process without flight of ideas or looseness of associations  Fetal heart tones: Not detected and discussed with preceptor, ultrasound obtained fetal cardiac activity noted  Assessment/Plan:  Susan Freeman is a 29 y.o. [id number] at Unknown  who presents to initiate prenatal care. She is doing well.  Current pregnancy issues include none.  1. Routine prenatal care:  As dating is reliable, a dating ultrasound has not been ordered. Dating tab updated.  Pre-pregnancy weight updated. Expected weight gain this pregnancy is 15-25 pounds  Prenatal labs reviewed, notable for GBS Bacteruira, will repeat and determine treatment.  G/C, Wet  Prep, and Pap smear obtained today.  Wet Prep negative.  Indications for referral to HROB were reviewed and the patient does not meet criteria for referral.   Medication list reviewed and updated.   Recommended patient see a dentist for regular care.   Bleeding and pain precautions reviewed.  Importance of prenatal vitamins reviewed.   Genetic screening offered. Patient opted for: patient undecided, will address at future visit.  The patient will not be age 54 or over at time of delivery. Referral to genetic counseling was not offered today.   The patient has the following risk factors for preexisting diabetes: BMI > 25 and high risk ethnicity (Latino, [place] American, Native American, [place] Islander, Asian Naval architect) . An early 1 hour glucose tolerance test was not ordered, can perform at next visit.  Pregnancy Medical Home and PHQ-9 forms completed, problems noted: No  2. GBS Bacteruira  Will need prophylaxis at time of delivery.  Will repeat culture today and treat if needed.   Follow up 4 weeks for next prenatal visit.

## 2020-09-02 NOTE — Patient Instructions (Signed)
Primer trimestre de Psychiatrist First Trimester of Pregnancy  El primer trimestre de Psychiatrist se extiende desde la semana1 hasta el final de la semana13 (mes1 al mes3). Durante este tiempo, el beb comenzar a desarrollarse dentro suyo. Entre la semana6 y Leon, se forman los ojos y Recruitment consultant, y los latidos del corazn pueden escucharse en la ecografa. Al final de las 12semanas, todos los rganos del beb estn formados. El cuidado prenatal es toda la asistencia mdica que usted recibe antes del nacimiento del beb. Asegrese de recibir un buen cuidado prenatal y de seguir todas las indicaciones del mdico. Siga estas indicaciones en su casa: Medicamentos  Tome los medicamentos de venta libre y los recetados solamente como se lo haya indicado el mdico. Algunos medicamentos son seguros para tomar durante el Psychiatrist y otros no lo son.  Tome vitaminas prenatales que contengan por lo menos (?g) de cido flico.  Si tiene problemas para defecar (estreimiento), tome un medicamento que ablanda la materia fecal (laxante), siempre que lo autorice el mdico. Comida y bebida   Ingiera alimentos saludables de [place] regular.  El Firefighter la cantidad de peso que [place].  No coma carne cruda ni quesos sin cocinar.  Si tiene Programme researcher, broadcasting/film/video (nuseas) o vomita (vmitos): ? Ingiera 4 o 5comidas pequeas por Geophysical data processor de 3abundantes. ? Intente comer algunas galletitas saladas. ? Beba lquidos Altria Group, en lugar de Boston Scientific.  Para evitar el estreimiento: ? Consuma alimentos ricos en fibra, como frutas y verduras frescas, cereales integrales y legumbres. ? Beba suficiente lquido para mantener el pis (orina) claro o de color amarillo plido. Actividad  Haga ejercicios solamente como se lo haya indicado el mdico. Deje de hacer ejercicios si tiene clicos o dolor en la parte baja del vientre (abdomen) o en la cintura.  No haga  actividad fsica si el clima est demasiado caluroso o hmedo, o si se encuentra en un lugar muy alto (altitud elevada).  Intente no estar de pie FedEx. Mueva las piernas con frecuencia si debe estar de pie en un lugar durante mucho tiempo.  Evite levantar pesos Fortune Brands.  Use zapatos con tacones bajos. Mantenga una buena postura al sentarse y pararse.  Puede tener [employer] [place] Travelers, a menos que el mdico le indique lo contrario. Alivio del dolor y del Dentist  Use un sostn que le brinde buen soporte si le duelen las [place].  Dese baos de asiento con agua tibia para Engineer, materials o las molestias causadas por las hemorroides. Use una crema antihemorroidal si el mdico se lo permite.  Descanse con las piernas elevadas si tiene calambres o dolor de cintura.  Si tiene las venas de las piernas hinchadas y abultadas (venas varicosas): ? Use medias elsticas de soporte o medias de compresin como se lo haya indicado el mdico. ? Levante (eleve) los pies durante , 3 o 4veces por Futures trader. ? Limite la sal en sus alimentos. Cuidado prenatal  Programe las visitas prenatales para la semana12 de [place].  Escriba sus preguntas. Llvelas cuando concurra a las visitas prenatales.  Concurra a todas las visitas prenatales como se lo haya indicado el mdico. Esto es importante. Seguridad  Use el cinturn de seguridad en todo momento mientras conduce.  Haga una lista con los nmeros de telfono en caso de Associate Professor. Esta lista debe incluir los nmeros de los familiares, los amigos, el hospital y los departamentos de polica y de bomberos. Instrucciones generales  Pdale  al mdico que la derive a clases prenatales en su localidad. Debe comenzar a tomar las clases antes de entrar en el mes6 de embarazo.  Pida ayuda si necesita asesoramiento o asistencia con la alimentacin. El mdico puede aconsejarla o indicarle dnde recurrir para recibir ayuda.  No se d baos de  inmersin en agua caliente, baos turcos ni saunas.  No se haga duchas vaginales ni use tampones o toallas higinicas perfumadas.  No mantenga las piernas cruzadas durante mucho tiempo.  Evite las hierbas y el alcohol. Evite los frmacos que el mdico no haya autorizado.  No consuma ningn producto que contenga tabaco, lo que incluye cigarrillos, tabaco de mascar o cigarrillos electrnicos. Si necesita ayuda para dejar de fumar, consulte al mdico. Puede recibir asesoramiento u otro tipo de apoyo para dejar de fumar.  Evite el contacto con las bandejas sanitarias de los gatos y la tierra que estos animales usan. Estos elementos contienen grmenes que pueden causar defectos congnitos al beb y la posible prdida del feto (aborto espontneo) o muerte fetal.  Visite al dentista. En su casa, lvese los dientes con un cepillo dental suave. Psese el hilo dental con suavidad. Comunquese con un mdico si:  Tiene mareos.  Tiene clicos leves o siente presin en la parte baja del vientre.  Sufre un dolor persistente en el abdomen.  Sigue teniendo malestar estomacal, vomita o la materia fecal es lquida (diarrea).  Nota una secrecin de lquido con olor ftido que proviene de la vagina.  Tiene dolor al hacer pis (orinar).  Tiene el rostro, las manos, las piernas o los tobillos ms hinchados (inflamados). Solicite ayuda de inmediato si:  Tiene fiebre.  Tiene una prdida de lquido por la vagina.  Tiene sangrado o pequeas prdidas vaginales.  Tiene clicos o dolor muy intensos en el vientre.  Sube o baja de peso rpidamente.  Vomita sangre. Esto tiene un aspecto similar a la borra del caf.  Est en contacto con personas que tienen rubola, la quinta enfermedad o varicela.  Siente un dolor de cabeza muy intenso.  Le falta el aire.  Sufre cualquier tipo de traumatismo, por ejemplo, debido a una cada o un accidente automovilstico. Resumen  El primer trimestre de embarazo se  extiende desde la semana1 hasta el final de la semana13 (mes1 al mes3).  Para cuidar su salud y la del beb en gestacin, necesitar consumir alimentos saludables, tomar medicamentos solamente si lo autoriza el mdico, y hacer actividades que sean seguras para usted y para su beb.  Concurra a todas las visitas de control como se lo haya indicado el mdico. Esto es importante porque el mdico deber asegurar que el beb est saludable y est creciendo bien. Esta informacin no tiene como fin reemplazar el consejo del mdico. Asegrese de hacerle al mdico cualquier pregunta que tenga. Document Revised: 05/21/2017 Document Reviewed: 05/21/2017 Elsevier Patient Education  2020 Elsevier Inc.  

## 2020-09-04 LAB — URINE CULTURE, OB REFLEX: Organism ID, Bacteria: NO GROWTH

## 2020-09-04 LAB — CULTURE, OB URINE

## 2020-09-06 LAB — CYTOLOGY - PAP
Chlamydia: NEGATIVE
Comment: NEGATIVE
Comment: NORMAL
Diagnosis: NEGATIVE
Neisseria Gonorrhea: NEGATIVE

## 2020-10-04 ENCOUNTER — Other Ambulatory Visit: Payer: Self-pay

## 2020-10-04 ENCOUNTER — Ambulatory Visit (INDEPENDENT_AMBULATORY_CARE_PROVIDER_SITE_OTHER): Payer: Self-pay | Admitting: Family Medicine

## 2020-10-04 VITALS — BP 99/70 | HR 80 | Wt 141.8 lb

## 2020-10-04 DIAGNOSIS — Z3A16 16 weeks gestation of pregnancy: Secondary | ICD-10-CM | POA: Insufficient documentation

## 2020-10-04 DIAGNOSIS — Z3492 Encounter for supervision of normal pregnancy, unspecified, second trimester: Secondary | ICD-10-CM | POA: Insufficient documentation

## 2020-10-04 NOTE — Progress Notes (Signed)
  Patient Name: Susan Freeman Date of Birth: [birth year] [facility] Prenatal Visit  Susan Freeman is a 29 y.o. G3P2002 at [redacted]w[redacted]d here for routine follow up. She is dated by LMP.  She reports no complaints and one small spot of bleeding a few weeks ago.  None since.. She has not yet felt baby move.  See flow sheet for details.  Early 1hr gtt will be performed today.  Vitals:   10/04/20 1554  BP: 99/70  Pulse: 80   FHT 146 bpm FH: 16 cm  A/P: Pregnancy at [redacted]w[redacted]d.  Doing well.    1. Routine Prenatal Care:  Marland Kitchen Dating reviewed, dating tab is correct .  Dated by exact, regular LMP. Marland Kitchen Fetal heart tones Appropriate . Influenza vaccine previously administered.  September [facility]. . COVID vaccination was discussed and patient plans to get at health department before delivery.  Please confirm at next visit that she received her first dose.  . The patient has the following indication for screening preexisting diabetes: BMI > 25 and high risk ethnicity (Latino, [place] American, Native American, Malawi Islander, Asian Naval architect) .  This was performed today . Anatomy ultrasound ordered to be scheduled at 18-20 weeks.  Schedule is not yet available for January, therefore CMA, Roosvelt Harps, given form and she plans to call again to schedule, then will call patient. . Patient is not interested in genetic screening. As she is past 13 weeks and 6 days, a Quad screen  was offered.  . Pregnancy education including expected weight gain in pregnancy, OTC medication use, continued use of prenatal vitamin, smoking cessation if applicable, and nutrition in pregnancy.   . Bleeding and pain precautions reviewed.  2. Pregnancy issues include the following and were addressed as appropriate today:  . GBS Bacteruria o Negative culture on repeat.  <100000 units therefore not treated with first culture o Will need GBS PPx at delivery . Problem list  and pregnancy box updated: Yes.    Follow up 4 weeks.

## 2020-10-04 NOTE — Patient Instructions (Signed)
Segundo trimestre de embarazo Second Trimester of Pregnancy  El segundo trimestre va desde la semana14 hasta la 27 (desde el mes 4 hasta el 6). Este suele ser el momento en el que mejor se siente. En general, las nuseas matutinas han disminuido o han desaparecido completamente. Tendr ms energa y podr aumentarle el apetito. El beb en gestacin se desarrolla rpidamente. Hacia el final del sexto mes, el beb mide aproximadamente 9 pulgadas (23 cm) y pesa alrededor de 1 libras (700 g). Es probable que sienta al beb moverse entre las 18 y 20 semanas del embarazo. Siga estas indicaciones en su casa: Medicamentos  Tome los medicamentos de venta libre y los recetados solamente como se lo haya indicado el mdico. Algunos medicamentos son seguros para tomar durante el embarazo y otros no lo son.  Tome vitaminas prenatales que contengan por lo menos 600microgramos (?g) de cido flico.  Si tiene dificultad para mover el intestino (estreimiento), tome un medicamento para ablandar las heces (laxante) si su mdico se lo autoriza. Comida y bebida   Ingiera alimentos saludables de manera regular.  No coma carne cruda ni quesos sin cocinar.  Si obtiene poca cantidad de calcio de los alimentos que ingiere, consulte a su mdico sobre la posibilidad de tomar un suplemento diario de calcio.  Evite el consumo de alimentos ricos en grasas y azcares, como los alimentos fritos y los dulces.  Si tiene malestar estomacal (nuseas) o devuelve (vomita): ? Ingiera 4 o 5comidas pequeas por da en lugar de 3abundantes. ? Intente comer algunas galletitas saladas. ? Beba lquidos entre las comidas, en lugar de hacerlo durante estas.  Para evitar el estreimiento: ? Consuma alimentos ricos en fibra, como frutas y verduras frescas, cereales integrales y frijoles. ? Beba suficiente lquido para mantener el pis (orina) claro o de color amarillo plido. Actividad  Haga ejercicios solamente como se lo haya  indicado el mdico. Interrumpa la actividad fsica si comienza a tener calambres.  No haga ejercicio si hace demasiado calor, hay demasiada humedad o se encuentra en un lugar de mucha altura (altitud alta).  Evite levantar pesos excesivos.  Use zapatos con tacones bajos. Mantenga una buena postura al sentarse y pararse.  Puede continuar teniendo relaciones sexuales, a menos que el mdico le indique lo contrario. Alivio del dolor y del malestar  Use un sostn que le brinde buen soporte si sus mamas estn sensibles.  Dese baos de asiento con agua tibia para aliviar el dolor o las molestias causadas por las hemorroides. Use una crema para las hemorroides si el mdico la autoriza.  Descanse con las piernas elevadas si tiene calambres o dolor de cintura.  Si desarrolla venas hinchadas y abultadas (vrices) en las piernas: ? Use medias de compresin o medias de descanso como se lo haya indicado el mdico. ? Levante (eleve) los pies durante 15minutos, 3 o 4veces por da. ? Limite el consumo de sal en sus alimentos. Cuidado prenatal  Escriba sus preguntas. Llvelas cuando concurra a las visitas prenatales.  Concurra a todas las visitas prenatales como se lo haya indicado el mdico. Esto es importante. Seguridad  Colquese el cinturn de seguridad cuando conduzca.  Haga una lista de los nmeros de telfono de emergencia, que incluya los nmeros de telfono de familiares, amigos, el hospital, as como los departamentos de polica y bomberos. Instrucciones generales  Consulte a su mdico sobre los alimentos que debe comer o pdale que la ayude a encontrar a quien pueda aconsejarla si necesita ese servicio.    Consulte a su mdico acerca de dnde se dictan clases prenatales cerca de donde vive. Comience las clases antes del mes 6 de embarazo.  No se d baos de inmersin en agua caliente, baos turcos ni saunas.  No se haga duchas vaginales ni use tampones o toallas higinicas perfumadas.   No mantenga las piernas cruzadas durante mucho tiempo.  Vaya al dentista si an no lo hizo. Use un cepillo de cerdas suaves para cepillarse los dientes. Psese el hilo dental suavemente.  No fume, no consuma hierbas ni beba alcohol. No tome frmacos que el mdico no haya autorizado.  No consuma ningn producto que contenga nicotina o tabaco, como cigarrillos y cigarrillos electrnicos. Si necesita ayuda para dejar de fumar, consulte al mdico.  Evite el contacto con las bandejas sanitarias de los gatos y la tierra que estos animales usan. Estos elementos contienen bacterias que pueden causar defectos congnitos al beb y la posible prdida del beb (aborto espontneo) o la muerte fetal. Comunquese con un mdico si:  Tiene clicos leves o siente presin en la parte baja del vientre.  Tiene dolor al hacer pis (orinar).  Advierte un lquido con olor ftido que proviene de la vagina.  Tiene malestar estomacal (nuseas), devuelve (vomita) o tiene deposiciones acuosas (diarrea).  Sufre un dolor persistente en el abdomen.  Siente mareos. Solicite ayuda de inmediato si:  Tiene fiebre.  Tiene una prdida de lquido por la vagina.  Tiene sangrado o pequeas prdidas vaginales.  Siente dolor intenso o clicos en el abdomen.  Sube o baja de peso rpidamente.  Tiene dificultades para recuperar el aliento y siente dolor en el pecho.  Sbitamente se le hinchan mucho el rostro, las manos, los tobillos, los pies o las piernas.  No ha sentido los movimientos del beb durante una hora.  Siente un dolor de cabeza intenso que no se alivia al tomar medicamentos.  Tiene dificultad para ver. Resumen  El segundo trimestre va desde la semana14 hasta la 27, desde el mes 4 hasta el 6. Este suele ser el momento en el que mejor se siente.  Para cuidarse y cuidar a su beb en gestacin, debe comer alimentos saludables, tomar medicamentos solamente si su mdico le indica que lo haga y hacer  actividades que sean seguras para usted y su beb.  Llame al mdico si se enferma o si nota algo inusual acerca de su embarazo. Tambin llame al mdico si necesita ayuda para saber qu alimentos debe comer o si quiere saber qu actividades puede realizar de forma segura. Esta informacin no tiene como fin reemplazar el consejo del mdico. Asegrese de hacerle al mdico cualquier pregunta que tenga. Document Revised: 07/10/2017 Document Reviewed: 07/10/2017 Elsevier Patient Education  2020 Elsevier Inc.  

## 2020-10-05 LAB — GLUCOSE TOLERANCE, 1 HOUR: Glucose, 1Hr PP: 117 mg/dL (ref 65–199)

## 2020-10-18 ENCOUNTER — Telehealth: Payer: Self-pay

## 2020-10-18 NOTE — Telephone Encounter (Signed)
Scheduled patient's Ultrasound for 11/03/2020 at 1115.  This  Appointment time conflicts with [facility] appointment at 1130.  Have messaged PCP to inform her of this conflict.  LVM for patient using [employer] ID # [id number].  Per conversation with Dr. Jamelle Rushing, conflict has been resolved.  Glennie Hawk, CMA

## 2020-10-29 NOTE — L&D Delivery Note (Addendum)
Delivery Note Susan Freeman is a 30 y.o. G3P2002 at [redacted]w[redacted]d admitted for active labor.   GBS Status: positive, given ampicillin   Maximum Maternal Temperature: 98.32F  Labor course: Initial SVE: 7/60/ballotable. Augmentation with: AROM. She then progressed to complete.  ROM: 0h 59m with clear fluid  Birth: At 1411 a viable female was delivered via spontaneous vaginal delivery (Presentation: cephalic, ROA). Nuchal cord present: No.  Shoulders and body delivered in usual fashion. Infant placed directly on mom's abdomen for bonding/skin-to-skin, baby dried and stimulated. Cord clamped x 2 after 1 minute and cut by resident.  Cord blood collected.  The placenta separated spontaneously and delivered via gentle cord traction.  Pitocin infused rapidly IV per protocol.  Fundus firm with massage.  Placenta inspected and appears to be intact with a 3 VC.  Placenta/Cord with the following complications: none .  Cord pH: N/A Sponge and instrument count were correct x2.  Intrapartum complications:  None Anesthesia:  none Episiotomy: none Lacerations:  none Suture Repair: N/A EBL (mL): 100 cc   Infant: APGAR (1 MIN): 9   APGAR (5 MINS): 9   Infant weight: pending  Mom to postpartum.  Baby to Couplet care / Skin to Skin. Placenta to L&D   Plans to Breastfeed Contraception: IUD Circumcision: declines  Note sent to  [facility]  for pp visit.  Littie Deeds, MD 03/15/2021 2:27 PM    I have seen and examined this patient and agree with above documentation in the resident's note. I was gloved and present at the time of delivery and available for questions/assistance.    Brand Males, MSN, CNM 03/15/21 2:44 PM

## 2020-11-03 ENCOUNTER — Telehealth: Payer: Self-pay

## 2020-11-03 NOTE — Telephone Encounter (Signed)
Called patient and informed her that she missed her appointment 11/03/2020 @ 1115 [facility] Department for Ultrasound.  Rescheduled for 11/21/2020 @ 0930 [facility] Department. Patient advised to carry 160.00 dollars.  Patient is aware of place and time.  Patient verbalized understanding.  Glennie Hawk, CMA

## 2020-11-24 ENCOUNTER — Ambulatory Visit (INDEPENDENT_AMBULATORY_CARE_PROVIDER_SITE_OTHER): Payer: Self-pay | Admitting: Family Medicine

## 2020-11-24 ENCOUNTER — Other Ambulatory Visit: Payer: Self-pay

## 2020-11-24 VITALS — BP 106/72 | HR 70 | Temp 98.1°F | Wt 142.0 lb

## 2020-11-24 DIAGNOSIS — R8290 Unspecified abnormal findings in urine: Secondary | ICD-10-CM

## 2020-11-24 DIAGNOSIS — Z3492 Encounter for supervision of normal pregnancy, unspecified, second trimester: Secondary | ICD-10-CM

## 2020-11-24 LAB — POCT URINALYSIS DIP (MANUAL ENTRY)
Bilirubin, UA: NEGATIVE
Blood, UA: NEGATIVE
Glucose, UA: NEGATIVE mg/dL
Ketones, POC UA: NEGATIVE mg/dL
Nitrite, UA: NEGATIVE
Protein Ur, POC: NEGATIVE mg/dL
Spec Grav, UA: 1.02 (ref 1.010–1.025)
Urobilinogen, UA: 1 E.U./dL
pH, UA: 7.5 (ref 5.0–8.0)

## 2020-11-24 LAB — POCT UA - MICROSCOPIC ONLY

## 2020-11-24 NOTE — Progress Notes (Signed)
error 

## 2020-11-24 NOTE — Patient Instructions (Addendum)
It was wonderful seeing you today.  I have no concerns regarding your pregnancy at this time.  Please consider receiving the COVID-19 vaccine.  If you have any vaginal bleeding, a gush of fluid like your water may have broken, contractions, decreased fetal movement please be assessed at the labor and delivery admissions unit.  I hope you have a wonderful afternoon!  Next ultrasound on 12/07/20 at 9am.  Fue maravilloso verte hoy. No tengo preocupaciones con respecto a su embarazo en este momento. Considere recibir la vacuna COVID-19. Si tiene sangrado vaginal, un chorro de lquido como el agua puede haberse roto, English as a second language teacher, disminucin del movimiento fetal, debe evaluarse en la unidad de admisiones de [place] de parto y [place]. Espero que tengas una tarde [place]!    Segundo trimestre de [employer] Second Trimester of Pregnancy  El segundo trimestre de Psychiatrist va desde la semana 13 hasta la semana 27. Tambin se dice que va desde el mes 4 hasta el mes 6 de [place]. Este suele ser el momento en el que mejor se siente. Durante el segundo trimestre:  Las nuseas del embarazo han disminuido o han desaparecido.  Usted puede tener ms energa.  Usted puede tener hambre con ms frecuencia. En esta poca, el beb en gestacin (feto) crece muy rpido. Hacia el final del sexto mes, el beb en gestacin puede medir aproximadamente 12 pulgadas y pesar alrededor de 1 libras. Es probable que comience a Engineer, structural beb se [employer] las 16 y las 20 [address] de Psychiatrist. Cambios en el cuerpo durante el segundo trimestre Su organismo contina atravesando por muchos cambios durante este perodo. Los cambios varan y generalmente vuelven a la normalidad despus del nacimiento del beb. Cambios fsicos  Aumentar ms peso.  Podrn aparecer las primeras estras en las caderas, el vientre (abdomen) y las [place].  Las [employer] crecern y Writer.  Pueden aparecer zonas oscuras o manchas en el  rostro.  Es posible que se forme una lnea oscura desde el ombligo hasta la zona del pubis (linea nigra).  Tal vez haya cambios en el cabello. Cambios en la salud  Es posible que tenga dolores de [place].  Es posible que tenga acidez estomacal.  Es posible que tenga dificultades para defecar (estreimiento).  Es posible que tenga hemorroides o venas abultadas e hinchadas (venas varicosas).  Las encas pueden sangrarle.  Es posible que haga pis (orine) con mayor frecuencia.  Puede sentir dolor en la espalda. Siga estas instrucciones en su casa: Medicamentos  Use los medicamentos de venta libre y los recetados solamente como se lo haya indicado el mdico. Algunos medicamentos no son seguros Academic librarian.  Tome vitaminas prenatales que contengan por lo menos (mcg) de cido flico. Comida y bebida  Consuma comidas saludables que incluyan lo siguiente: ? Nils Pyle y verduras frescas. ? Cereales integrales. ? Buenas fuentes de protenas, como carne, huevos y tofu. ? Productos lcteos con bajo contenido de grasa.  Evite la carne cruda y el [place], la [place] y el queso sin Market researcher.  Es posible que deba tomar medidas para prevenir o tratar los problemas para defecar: ? Product manager suficiente lquido para Radio producer pis (orina) de color amarillo plido. ? Come alimentos ricos en fibra. Entre ellos, frijoles, cereales integrales y frutas y verduras frescas. ? Limitar los alimentos con alto contenido de grasa y International aid/development worker. Estos incluyen alimentos fritos o dulces. Actividad  Haga ejercicios solamente como se lo haya indicado el mdico. La mayora de las personas pueden  realizar su actividad fsica habitual durante el [place]. Intente realizar como mnimo de actividad fsica por lo menos 5das a la semana.  Deje de hacer ejercicio si tiene dolor o clicos en el vientre o en la zona lumbar.  No haga ejercicio si hace demasiado calor, hay demasiada humedad o se  encuentra en un lugar de mucha altura (altitud elevada).  Evite levantar pesos [employer].  Si lo desea, puede continuar teniendo [employer], a menos que el mdico le indique lo contrario. Alivio del dolor y del Dentist  Use un sostn que le brinde buen soporte si le duelen las [place].  Dese baos de asiento con agua tibia para Engineer, materials o las molestias causadas por las hemorroides. Use una crema para las hemorroides si el mdico la autoriza.  Descanse con las piernas levantadas (elevadas) si tiene calambres en las piernas o dolor en la parte baja de la espalda.  Si desarrolla venas abultadas en las piernas: ? Use medias de compresin segn las indicaciones de su mdico. ? Levante los pies durante , 3 o 4veces por Futures trader. ? Limite la sal en sus alimentos. Seguridad  Use el cinturn de seguridad en todo momento mientras vaya en auto.  Hable con el mdico si alguien le est haciendo dao o gritando [place]. Estilo de vida  No se d baos de inmersin en agua caliente, baos turcos ni saunas.  No se haga duchas vaginales. No use tampones ni toallas higinicas perfumadas.  Evite el contacto con las bandejas sanitarias de los gatos y la tierra que estos animales usan. Estos contienen grmenes que pueden daar al beb y causar la prdida del beb ya sea aborto espontneo o muerte fetal.  No consuma medicamentos a base de hierbas, drogas ilegales, ni medicamentos que el mdico no haya autorizado. No beba alcohol.  No fume ni consuma ningn producto que contenga nicotina o tabaco. Si necesita ayuda para dejar de fumar, consulte al mdico. Instrucciones generales  Cumpla con todas las visitas de seguimiento. Esto es importante.  Consulte a su mdico acerca de dnde se dictan clases prenatales cerca de donde vive.  Consulte a su mdico sobre los [employer] debe comer o pdale que la ayude a Clinical research associate a Facilities manager. Dnde buscar ms informacin  American Pregnancy  Association (Asociacin Americana del Embarazo): americanpregnancy.org  [employer] Corporation of Obstetricians and Gynecologists (Colegio Estadounidense de Obstetras y Gineclogos): www.acog.org  Office on [employer] (Oficina para la Salud de la Mujer): MightyReward.co.nz Comunquese con un mdico si:  Tiene un dolor de cabeza que no desaparece despus de Science writer.  Nota cambios en la visin o ve manchas delante de los ojos.  Tiene clicos o siente presin o dolor leves en la parte baja del vientre.  Sigue sintiendo como si fuera a vomitar (nuseas), vomita o hace deposiciones acuosas (diarrea).  Advierte lquido con mal olor que proviene de la vagina.  Siente dolor al orinar o hace orina con mal olor.  Tiene una gran hinchazn en la cara, las manos, las piernas, los tobillos o los pies.  Tiene fiebre. Solicite ayuda de inmediato si:  Tiene una prdida de lquido por la vagina.  Tiene sangrado o pequeas prdidas vaginales.  Tiene clicos o dolor muy intensos en el vientre.  Tiene dificultad para respirar.  Sientes dolor en el pecho.  Se desmaya.  No ha sentido que el beb se moviera durante el perodo de tiempo que le dijo el mdico.  Tiene dolor, hinchazn o enrojecimiento  nuevos en un brazo o una pierna o se produce un aumento de alguno de estos sntomas. Resumen  El segundo trimestre de embarazo va desde la semana 13 hasta la 27 (desde el mes 4 hasta el 6).  Consuma comidas saludables.  Haga ejercicios tal como le indic el mdico. La mayora de las personas pueden realizar su actividad fsica habitual durante el [place].  No consuma medicamentos a base de hierbas, drogas ilegales, ni medicamentos que el mdico no haya autorizado. No beba alcohol.  Llame al mdico si se enferma o si nota algo inusual acerca de su embarazo. Esta informacin no tiene Theme park manager el consejo del mdico. Asegrese de hacerle al mdico cualquier pregunta que  tenga. Document Revised: 04/29/2020 Document Reviewed: 04/29/2020 Elsevier Patient Education  2021 [employer].

## 2020-11-24 NOTE — Progress Notes (Signed)
  [facility] Prenatal Visit  Susan Freeman is a 30 y.o. G3P2002 at [redacted]w[redacted]d here for routine follow up. She is dated by LMP.  She reports backache.  She reports good fetal movement. No bleeding, loss of fluid, contractions. See flow sheet for details. Vitals:   11/24/20 0800  BP: 106/72  Pulse: 70  Temp: 98.1 F (36.7 C)    A/P: Pregnancy at [redacted]w[redacted]d.  Doing well.   . Dating reviewed, dating tab is correct, dating by midtrimester ultrasound as well as LMP. Marland Kitchen Fetal heart tones Appropriate . Fundal height within expected range.  . Anatomy ultrasound has been completed.  No abnormalities although they could not get a good view of the spine so it will need to be repeated. . Influenza vaccine previously administered. .  October 2021 . COVID vaccination was discussed and patient is not ready for the vaccine at this time..  . Indications for screening for preexisting diabetes include: BMI > 25 and high risk ethnicity (Latino, [place] American, Native American, Mal[place] Islander, Asian Naval architect) .  Patient received 1 hour which came back normal at 117. . Pregnancy education provided on the following topics: fetal growth and movement, ultrasound assessment, and upcoming laboratory assessment.   . Patient will be scheduled for third trimester OB clinic at future visit . Preterm labor precautions given.   PHQ9 SCORE ONLY 11/24/2020 10/04/2020 09/02/2020  PHQ-9 Total Score 1 3 8     2. Pregnancy issues include the following and were addressed as appropriate today:  . Discussed COVID-19 vaccine and patient will consider it and possibly receive it at future visits -Patient already received flu vaccine on August 22, 2020 -Patient will need hepatitis C labs when 28-week labs were collected -Urinalysis was collected today because patient had concerns due to odor of urine, denied any burning with urination or pelvic pain . Problem list and pregnancy box updated: Yes.    Follow up 4  weeks.

## 2020-11-27 LAB — URINE CULTURE, OB REFLEX

## 2020-11-27 LAB — CULTURE, OB URINE

## 2020-11-30 ENCOUNTER — Telehealth: Payer: Self-pay | Admitting: Family Medicine

## 2020-11-30 MED ORDER — CEPHALEXIN 500 MG PO CAPS: 500.0000 mg | ORAL_CAPSULE | Freq: Two times a day (BID) | ORAL | 0 refills | Status: DC

## 2020-11-30 NOTE — Telephone Encounter (Signed)
Called patient using spanish interpreter to discuss urine culture results No answer, LVM using interpreter asking her to call back.  Culture grew e coli, recommend treating with keflex 500mg  twice daily for 7 days. We should repeat her urine culture at her next visit to make sure it's resolved.  I sent in prescription for her. I am happy to talk with her if she has questions.  , MD

## 2020-12-16 ENCOUNTER — Encounter: Payer: Self-pay | Admitting: Family Medicine

## 2020-12-26 ENCOUNTER — Encounter: Payer: Self-pay | Admitting: Family Medicine

## 2020-12-27 ENCOUNTER — Encounter: Payer: Self-pay | Admitting: Family Medicine

## 2020-12-30 ENCOUNTER — Ambulatory Visit (INDEPENDENT_AMBULATORY_CARE_PROVIDER_SITE_OTHER): Payer: Self-pay | Admitting: Family Medicine

## 2020-12-30 ENCOUNTER — Other Ambulatory Visit: Payer: Self-pay

## 2020-12-30 VITALS — BP 102/66 | HR 63 | Wt 143.2 lb

## 2020-12-30 DIAGNOSIS — Z3492 Encounter for supervision of normal pregnancy, unspecified, second trimester: Secondary | ICD-10-CM

## 2020-12-30 NOTE — Progress Notes (Signed)
  [facility] Prenatal Visit  Yumalay Circle is a 30 y.o. G3P2002 at [redacted]w[redacted]d here for routine follow up. She is dated by LMP.  She reports no complaints. She reports fetal movement. She denies vaginal bleeding, contractions, or loss of fluid. See flow sheet for details.  Vitals:   12/30/20 0924  BP: 102/66  Pulse: 63      A/P: Pregnancy at [redacted]w[redacted]d.  Doing well.   1. Routine prenatal care:  Marland Kitchen Dating reviewed, dating tab is correct . Fetal heart tones Appropriate . Fundal height within expected range.  . Infant feeding choice: Breastfeeding . Contraception choice: IUD . Infant circumcision desired no  . The patient does not have a history of Cesarean delivery and no referral to Center for [facility] is indicated . Influenza vaccine previously administered.   . Tdap was not given today. Patient instructed to get it at the Health Department, letter recommending this provided. . 1 hour glucola, CBC, RPR, Hep C and HIV were obtained today.    . Rh status was reviewed and patient does not need Rhogam.  Rhogam was not given today.  . Pregnancy medical home and PHQ-9 forms were done today and reviewed. Endorsing good mood.  . Childbirth and education classes were offered. . Pregnancy education regarding benefits of breastfeeding, contraception, fetal growth, expected weight gain, and safe infant sleep were discussed.  . Preterm labor and fetal movement precautions reviewed. . Patient last urine culture notable for E. Coli, patient currently asymptomatic. Repeat urine culture performed, initiation of treatment only if culture detects ample bacteria.   2. Pregnancy issues include the following and were addressed as appropriate today: . COVID vaccine extensively discussed, patient declined today, she will consider getting it at the Health Department. . Problem list and pregnancy box updated: Yes.   Patient to be [facility] in [facility] during third trimester.   Follow up 2 weeks.

## 2020-12-30 NOTE — Patient Instructions (Addendum)
It was great seeing you today!  I am glad your pregnancy course is going well thus far. We got some blood work today, I will get in touch with you of any abnormal results. Please go to the health department to get your COVID and Tdap vaccines.   For more information on childbirth and education classes, please go to [email or web address]  Go to the MAU at [facility] if:  You begin to have strong, frequent contractions  Your water breaks.  Sometimes it is a big gush of fluid, sometimes it is just a trickle that keeps getting your underwear wet or running down your legs  You have vaginal bleeding.  It is normal to have a small amount of spotting if your cervix was checked.   You do not feel your baby moving like normal.  If you do not, get something to eat and drink and lay down and focus on feeling your baby move.   If your baby is still not moving like normal, you should go to MAU.   Please follow up in 2 weeks, if anything arises between now and then, please don't hesitate to contact our office.   Thank you for allowing [place] to be a part of your medical care!  Thank you, Dr. Robyne Peers    [email or web address]">  Tercer trimestre de Psychiatrist Third Trimester of Pregnancy  El tercer trimestre de embarazo va desde la semana 28 hasta la semana 40. Esto corresponde a los meses 7 a 9. El tercer trimestre es un perodo en el que el beb en gestacin (feto) crece rpidamente. Hacia el final del noveno mes, el feto mide alrededor de 20pulgadas (45cm) de largo y pesa entre 6 y 10 libras (2.7 y 4.5kg). Cambios en el cuerpo durante el tercer trimestre Durante el tercer trimestre, su cuerpo contina experimentando numerosos cambios. Los cambios varan y generalmente vuelven a la normalidad despus del nacimiento del beb. Cambios fsicos  Seguir [employer]. Es de esperar que aumente entre 25 y 35libras  (11 y 16kg) Psychiatric nurse final del embarazo si inicia el Psychiatrist con un peso normal. Si tiene bajo [place], es de esperar que aumente entre 28 y 40 libras (13 y18 kg), y si tiene sobrepeso, es de esperar que aumente entre 15 y 25 libras (7 y 11kg).  Podrn aparecer las primeras Albertson's caderas, el abdomen y las [place].  Las ConAgra Foods seguirn creciendo y Writer. Un lquido amarillo [name]) puede salir de sus pechos. Esta es la primera leche que usted produce para su beb.  Tal vez haya cambios en el cabello. Esto cambios pueden incluir su engrosamiento, crecimiento rpido y Allied Waste Industries textura. A algunas personas tambin se les cae el cabello durante o despus del [place], o tienen el cabello seco o fino.  El ombligo puede salir hacia afuera.  Puede observar que se le Eli Lilly and Company, el rostro o los tobillos. Cambios en la salud  Es posible que tenga acidez estomacal.  Puede sufrir estreimiento.  Puede desarrollar hemorroides.  Puede desarrollar venas hinchadas y abultadas (venas varicosas) en las piernas.  Puede presentar ms dolor en la pelvis, la espalda o los muslos. Esto se debe al Citigroup de peso y al aumento de las hormonas que relajan las articulaciones.  Puede presentar un aumento del hormigueo o entumecimiento en las manos, brazos y piernas. La piel de su abdomen tambin puede sentirse entumecida.  Puede sentir que le falta el aire debido  a que se expande el tero. Otros cambios  Puede tener necesidad de Geographical information systems officer con ms frecuencia porque el feto baja hacia la pelvis y ejerce presin sobre la vejiga.  Puede tener ms problemas para dormir. Esto puede deberse al tamao de su abdomen, una mayor necesidad de orinar y un aumento en el metabolismo de su cuerpo.  Puede notar que el feto "baja" o lo siente ms bajo, en el abdomen (aligeramiento).  Puede tener un aumento de la secrecin vaginal.  Puede notar que tiene dolor alrededor del hueso plvico a medida que  el tero se distiende. Siga estas instrucciones en su casa: Medicamentos  Siga las instrucciones del mdico en relacin con el uso de medicamentos. Durante el embarazo, hay medicamentos que pueden tomarse y otros que no. No tome ningn medicamento a menos que lo haya autorizado el mdico.  Tome vitaminas prenatales que contengan por lo menos (mcg) de cido flico. Comida y bebida  Lleve una dieta saludable que incluya frutas y verduras frescas, cereales integrales, buenas fuentes de protenas como carnes [place], huevos o tofu, y productos lcteos descremados.  Evite la carne cruda y el [place], la [place] y el queso sin Market researcher. Estos portan grmenes que pueden provocar dao tanto a usted como al beb.  Tome 4 o 5 comidas pequeas en lugar de 3 comidas abundantes al da.  Es posible que tenga que tomar estas medidas para prevenir o tratar el estreimiento: ? Product manager suficiente lquido como para Pharmacologist la orina de color amarillo plido. ? Consumir alimentos ricos en fibra, como frijoles, cereales integrales, y frutas y verduras frescas. ? Limitar el consumo de alimentos ricos en grasa y azcares procesados, como los alimentos fritos o dulces. Actividad  Haga ejercicio solamente como se lo haya indicado el mdico. La mayora de las personas pueden continuar su actividad fsica habitual durante el [place]. Intente realizar como mnimo de actividad fsica por lo menos 5das a la semana. Deje de hacer ejercicio si experimenta contracciones en el tero.  Deje de hacer ejercicio si le aparecen dolor o clicos en la parte baja del vientre o de la espalda.  Evite levantar pesos [employer].  No haga ejercicio si hace mucho calor o humedad, o si se encuentra a una altitud elevada.  Si lo desea, puede seguir teniendo [employer], salvo que el mdico le indique lo contrario. Alivio del dolor y del [place] pausas frecuentes y descanse con las piernas  levantadas (elevadas) si tiene calambres en las piernas o dolor en la parte baja de la espalda.  Dese baos de asiento con agua tibia para Engineer, materials o las molestias causadas por las hemorroides. Use una crema para las hemorroides si el mdico la autoriza.  Use un sujetador que le brinde buen soporte para prevenir las molestias causadas por la sensibilidad en las [place].  Si tiene venas varicosas: ? Use medias de compresin como se lo haya indicado el mdico. ? Eleve los pies durante , 3 o 4veces por da. ? Limite el consumo de sal en su dieta. Seguridad  Hable con su mdico antes de viajar distancias largas.  No se d baos de inmersin en agua caliente, baos turcos ni saunas.  Use el cinturn de seguridad en todo momento mientras conduce o va en auto.  Hable con el mdico si es vctima de [employer] o fsico. Preparacin para el nacimiento Para prepararse para la llegada de su beb:  Tome clases prenatales para entender, Education administrator, y Radio producer  preguntas sobre el [place] de parto y [place].  Visite el hospital y recorra el rea de maternidad.  Compre un asiento de seguridad [employer], y asegrese de saber cmo instalarlo en su automvil.  Prepare la habitacin o el lugar donde dormir el beb. Asegrese de quitar todas las almohadas y [place] de peluche de la cuna del beb para evitar la asfixia. Indicaciones generales  Evite el contacto con las bandejas sanitarias de los gatos y la tierra que estos animales usan. Estos alimentos contienen bacterias que pueden causar defectos congnitos en el beb. Si tiene Financial controller, pdale a alguien que limpie la caja de arena por usted.  No se haga lavados vaginales ni use tampones. No use toallas higinicas perfumadas.  No consuma ningn producto que contenga nicotina o tabaco, como cigarrillos, cigarrillos electrnicos y tabaco de Theatre manager. Si necesita ayuda para dejar de consumir estos productos, consulte al  mdico.  No use ningn remedio a base de hierbas, drogas ilegales o medicamentos que no le hayan sido recetados. Las sustancias qumicas de estos productos pueden daar al beb.  No beba alcohol.  Le realizarn exmenes prenatales ms frecuentes durante el tercer trimestre. Durante una visita prenatal de rutina, el mdico le har un examen fsico, Civil engineer, contracting pruebas y Heritage manager con usted de su salud general. Cumpla con todas las visitas de seguimiento. Esto es importante. Dnde buscar ms informacin  American Pregnancy Association (Asociacin Estadounidense del Embarazo): americanpregnancy.org  Cel[employer] Corporation of Obstetricians and Gynecologists (Colegio Estadounidense de Obstetras y [place]): [email or web address].cz?  Office on Pitney Bowes (Oficina para la Salud de la Mujer): [email or web address] Comunquese con un mdico si tiene:  Teacher, English as a foreign language.  Clicos leves en la pelvis, presin en la pelvis o dolor persistente en la zona abdominal o la parte baja de la espalda.  Vmitos o diarrea.  Secrecin vaginal con mal olor u orina con mal olor.  Dolor al Beatrix Shipper.  Un dolor de cabeza que no desaparece despus de Science writer.  Cambios en la visin o ve manchas delante de los ojos. Solicite ayuda de inmediato si:  Rompe la bolsa.  Tiene contracciones regulares separadas por menos de .  Tiene sangrado o pequeas prdidas vaginales.  Siente un dolor abdominal intenso.  Tiene dificultad para respirar.  Siente dolor en el pecho.  Sufre episodios de Baxter International.  No ha sentido a su beb moverse durante el perodo de [employer] Energy indic el mdico.  Tiene dolor, hinchazn o enrojecimiento nuevos en un brazo o una pierna o se produce un aumento de alguno de estos sntomas. Resumen  El tercer trimestre del Psychiatrist comprende desde la semana28 hasta la semana 40 (desde el mes7 hasta el mes9).  Puede tener ms problemas para dormir. Esto puede deberse  al tamao de su abdomen, una mayor necesidad de orinar y un aumento en el metabolismo de su cuerpo.  Le realizarn exmenes prenatales ms frecuentes durante el tercer trimestre. Cumpla con todas las visitas de seguimiento. Esto es importante. Esta informacin no tiene Theme park manager el consejo del mdico. Asegrese de hacerle al mdico cualquier pregunta que tenga. Document Revised: 04/22/2020 Document Reviewed: 04/22/2020 Elsevier Patient Education  2021 [employer].

## 2020-12-31 LAB — CBC WITH DIFFERENTIAL/PLATELET
Basophils Absolute: 0 10*3/uL (ref 0.0–0.2)
Basos: 0 %
EOS (ABSOLUTE): 0.1 10*3/uL (ref 0.0–0.4)
Eos: 1 %
Hematocrit: 37.3 % (ref 34.0–46.6)
Hemoglobin: 12.6 g/dL (ref 11.1–15.9)
Immature Grans (Abs): 0.1 10*3/uL (ref 0.0–0.1)
Immature Granulocytes: 1 %
Lymphocytes Absolute: 2.1 10*3/uL (ref 0.7–3.1)
Lymphs: 25 %
MCH: 29.9 pg (ref 26.6–33.0)
MCHC: 33.8 g/dL (ref 31.5–35.7)
MCV: 88 fL (ref 79–97)
Monocytes Absolute: 0.6 10*3/uL (ref 0.1–0.9)
Monocytes: 7 %
Neutrophils Absolute: 5.7 10*3/uL (ref 1.4–7.0)
Neutrophils: 66 %
Platelets: 241 10*3/uL (ref 150–450)
RBC: 4.22 x10E6/uL (ref 3.77–5.28)
RDW: 13.8 % (ref 11.7–15.4)
WBC: 8.6 10*3/uL (ref 3.4–10.8)

## 2020-12-31 LAB — HIV ANTIBODY (ROUTINE TESTING W REFLEX): HIV Screen 4th Generation wRfx: NONREACTIVE

## 2020-12-31 LAB — GLUCOSE TOLERANCE, 1 HOUR: Glucose, 1Hr PP: 83 mg/dL (ref 65–199)

## 2020-12-31 LAB — HEPATITIS C ANTIBODY: Hep C Virus Ab: <0.1 s/co ratio (ref 0.0–0.9)

## 2020-12-31 LAB — RPR: RPR Ser Ql: NONREACTIVE

## 2021-01-02 LAB — URINE CULTURE

## 2021-01-04 ENCOUNTER — Other Ambulatory Visit: Payer: Self-pay | Admitting: Family Medicine

## 2021-01-04 DIAGNOSIS — R8290 Unspecified abnormal findings in urine: Secondary | ICD-10-CM

## 2021-01-04 MED ORDER — CEPHALEXIN 500 MG PO CAPS: 500.0000 mg | ORAL_CAPSULE | Freq: Two times a day (BID) | ORAL | 0 refills | Status: DC

## 2021-01-18 ENCOUNTER — Other Ambulatory Visit: Payer: Self-pay

## 2021-01-18 ENCOUNTER — Ambulatory Visit (INDEPENDENT_AMBULATORY_CARE_PROVIDER_SITE_OTHER): Payer: Self-pay | Admitting: Family Medicine

## 2021-01-18 DIAGNOSIS — Z3492 Encounter for supervision of normal pregnancy, unspecified, second trimester: Secondary | ICD-10-CM

## 2021-01-18 NOTE — Patient Instructions (Addendum)
It was great seeing you today!  I am glad you are doing well. Please take Keflex 500 mg two times a day for 7 days. Please complete this antibiotic course even if you do not have symptoms so that we treat the bacteria that is present which will benefit both you and baby.   Please remember to go to the Health Department to get the 2nd dose of your COVID vaccine.   Go to the MAU at [facility] if:  You begin to have strong, frequent contractions  Your water breaks.  Sometimes it is a big gush of fluid, sometimes it is just a trickle that keeps getting your underwear wet or running down your legs  You have vaginal bleeding.  It is normal to have a small amount of spotting if your cervix was checked.   You do not feel your baby moving like normal.  If you do not, get something to eat and drink and lay down and focus on feeling your baby move.  If your baby is still not moving like normal, you should go to MAU.  Please follow up in 2 weeks at your next scheduled appointment, if anything arises between now and then, please don't hesitate to contact our office.   Thank you for allowing [place] to be a part of your medical care!  Thank you, Dr. Robyne Peers   [email or web address]">  Tercer trimestre de Psychiatrist Third Trimester of Pregnancy  El tercer trimestre de embarazo va desde la semana 28 hasta la semana 40. Esto corresponde a los meses 7 a 9. El tercer trimestre es un perodo en el que el beb en gestacin (feto) crece rpidamente. Hacia el final del noveno mes, el feto mide alrededor de 20pulgadas (45cm) de largo y pesa entre 6 y 10 libras (2.7 y 4.5kg). Cambios en el cuerpo durante el tercer trimestre Durante el tercer trimestre, su cuerpo contina experimentando numerosos cambios. Los cambios varan y generalmente vuelven a la normalidad despus del nacimiento del beb. Cambios fsicos  Seguir [employer]. Es de esperar que aumente entre 25 y 35libras (11 y 16kg) Psychiatric nurse final del embarazo si inicia el Psychiatrist con un peso normal. Si tiene bajo [place], es de esperar que aumente entre 28 y 40 libras (13 y18 kg), y si tiene sobrepeso, es de esperar que aumente entre 15 y 25 libras (7 y 11kg).  Podrn aparecer las primeras Albertson's caderas, el abdomen y las [place].  Las ConAgra Foods seguirn creciendo y Writer. Un lquido amarillo [name]) puede salir de sus pechos. Esta es la primera leche que usted produce para su beb.  Tal vez haya cambios en el cabello. Esto cambios pueden incluir su engrosamiento, crecimiento rpido y Allied Waste Industries textura. A algunas personas tambin se les cae el cabello durante o despus del [place], o tienen el cabello seco o fino.  El ombligo puede salir hacia afuera.  Puede observar que se le Eli Lilly and Company, el rostro o los tobillos. Cambios en la salud  Es posible que tenga acidez estomacal.  Puede sufrir estreimiento.  Puede desarrollar hemorroides.  Puede desarrollar venas hinchadas y abultadas (venas varicosas) en las piernas.  Puede presentar ms dolor en la pelvis, la espalda o los muslos. Esto se debe al Citigroup de peso y al aumento de las hormonas que relajan las articulaciones.  Puede presentar un aumento del hormigueo o entumecimiento en las manos, brazos y piernas. La piel de su  abdomen tambin puede sentirse entumecida.  Puede sentir que le falta el aire debido a que se expande el tero. Otros cambios  Puede tener necesidad de Geographical information systems officer con ms frecuencia porque el feto baja hacia la pelvis y ejerce presin sobre la vejiga.  Puede tener ms problemas para dormir. Esto puede deberse al tamao de su abdomen, una mayor necesidad de orinar y un aumento en el metabolismo de su cuerpo.  Puede notar que el feto "baja" o lo siente ms bajo, en el abdomen (aligeramiento).  Puede tener un aumento de la secrecin vaginal.  Puede notar que  tiene dolor alrededor del hueso plvico a medida que el tero se distiende. Siga estas instrucciones en su casa: Medicamentos  Siga las instrucciones del mdico en relacin con el uso de medicamentos. Durante el embarazo, hay medicamentos que pueden tomarse y otros que no. No tome ningn medicamento a menos que lo haya autorizado el mdico.  Tome vitaminas prenatales que contengan por lo menos (mcg) de cido flico. Comida y bebida  Lleve una dieta saludable que incluya frutas y verduras frescas, cereales integrales, buenas fuentes de protenas como carnes [place], huevos o tofu, y productos lcteos descremados.  Evite la carne cruda y el [place], la [place] y el queso sin Market researcher. Estos portan grmenes que pueden provocar dao tanto a usted como al beb.  Tome 4 o 5 comidas pequeas en lugar de 3 comidas abundantes al da.  Es posible que tenga que tomar estas medidas para prevenir o tratar el estreimiento: ? Product manager suficiente lquido como para Pharmacologist la orina de color amarillo plido. ? Consumir alimentos ricos en fibra, como frijoles, cereales integrales, y frutas y verduras frescas. ? Limitar el consumo de alimentos ricos en grasa y azcares procesados, como los alimentos fritos o dulces. Actividad  Haga ejercicio solamente como se lo haya indicado el mdico. La mayora de las personas pueden continuar su actividad fsica habitual durante el [place]. Intente realizar como mnimo de actividad fsica por lo menos 5das a la semana. Deje de hacer ejercicio si experimenta contracciones en el tero.  Deje de hacer ejercicio si le aparecen dolor o clicos en la parte baja del vientre o de la espalda.  Evite levantar pesos [employer].  No haga ejercicio si hace mucho calor o humedad, o si se encuentra a una altitud elevada.  Si lo desea, puede seguir teniendo [employer], salvo que el mdico le indique lo contrario. Alivio del dolor y del  [place] pausas frecuentes y descanse con las piernas levantadas (elevadas) si tiene calambres en las piernas o dolor en la parte baja de la espalda.  Dese baos de asiento con agua tibia para Engineer, materials o las molestias causadas por las hemorroides. Use una crema para las hemorroides si el mdico la autoriza.  Use un sujetador que le brinde buen soporte para prevenir las molestias causadas por la sensibilidad en las [place].  Si tiene venas varicosas: ? Use medias de compresin como se lo haya indicado el mdico. ? Eleve los pies durante , 3 o 4veces por da. ? Limite el consumo de sal en su dieta. Seguridad  Hable con su mdico antes de viajar distancias largas.  No se d baos de inmersin en agua caliente, baos turcos ni saunas.  Use el cinturn de seguridad en todo momento mientras conduce o va en auto.  Hable con el mdico si es vctima de [employer] o fsico. Preparacin para el nacimiento Para prepararse para  la llegada de su beb:  Tome clases prenatales para entender, Education administrator, y hacer preguntas sobre el [place] de parto y [place].  Visite el hospital y recorra el rea de maternidad.  Compre un asiento de seguridad [employer], y asegrese de saber cmo instalarlo en su automvil.  Prepare la habitacin o el lugar donde dormir el beb. Asegrese de quitar todas las almohadas y [place] de peluche de la cuna del beb para evitar la asfixia. Indicaciones generales  Evite el contacto con las bandejas sanitarias de los gatos y la tierra que estos animales usan. Estos alimentos contienen bacterias que pueden causar defectos congnitos en el beb. Si tiene Financial controller, pdale a alguien que limpie la caja de arena por usted.  No se haga lavados vaginales ni use tampones. No use toallas higinicas perfumadas.  No consuma ningn producto que contenga nicotina o tabaco, como cigarrillos, cigarrillos electrnicos y tabaco de Theatre manager. Si necesita  ayuda para dejar de consumir estos productos, consulte al mdico.  No use ningn remedio a base de hierbas, drogas ilegales o medicamentos que no le hayan sido recetados. Las sustancias qumicas de estos productos pueden daar al beb.  No beba alcohol.  Le realizarn exmenes prenatales ms frecuentes durante el tercer trimestre. Durante una visita prenatal de rutina, el mdico le har un examen fsico, Civil engineer, contracting pruebas y Heritage manager con usted de su salud general. Cumpla con todas las visitas de seguimiento. Esto es importante. Dnde buscar ms informacin  American Pregnancy Association (Asociacin Estadounidense del Embarazo): americanpregnancy.org  [employer] Corporation of Obstetricians and Gynecologists (Colegio Estadounidense de Obstetras y [place]): EmploymentAssurance.cz?  Office on Pitney Bowes (Oficina para la Salud de la Mujer): [email or web address] Comunquese con un mdico si tiene:  Teacher, English as a foreign language.  Clicos leves en la pelvis, presin en la pelvis o dolor persistente en la zona abdominal o la parte baja de la espalda.  Vmitos o diarrea.  Secrecin vaginal con mal olor u orina con mal olor.  Dolor al Beatrix Shipper.  Un dolor de cabeza que no desaparece despus de Science writer.  Cambios en la visin o ve manchas delante de los ojos. Solicite ayuda de inmediato si:  Rompe la bolsa.  Tiene contracciones regulares separadas por menos de .  Tiene sangrado o pequeas prdidas vaginales.  Siente un dolor abdominal intenso.  Tiene dificultad para respirar.  Siente dolor en el pecho.  Sufre episodios de Baxter International.  No ha sentido a su beb moverse durante el perodo de [employer] Energy indic el mdico.  Tiene dolor, hinchazn o enrojecimiento nuevos en un brazo o una pierna o se produce un aumento de alguno de estos sntomas. Resumen  El tercer trimestre del Psychiatrist comprende desde la semana28 hasta la semana 40 (desde el mes7 hasta el  mes9).  Puede tener ms problemas para dormir. Esto puede deberse al tamao de su abdomen, una mayor necesidad de orinar y un aumento en el metabolismo de su cuerpo.  Le realizarn exmenes prenatales ms frecuentes durante el tercer trimestre. Cumpla con todas las visitas de seguimiento. Esto es importante. Esta informacin no tiene Theme park manager el consejo del mdico. Asegrese de hacerle al mdico cualquier pregunta que tenga. Document Revised: 04/22/2020 Document Reviewed: 04/22/2020 Elsevier Patient Education  2021 [employer].

## 2021-01-18 NOTE — Progress Notes (Signed)
  [facility] Prenatal Visit  Susan Freeman is a 30 y.o. G3P2002 at [redacted]w[redacted]d here for routine follow up. She is dated by LMP.  She reports no complaints. She reports fetal movement. She denies vaginal bleeding, contractions, or loss of fluid. See flow sheet for details.  Vitals:   01/18/21 0857  BP: 110/62  Pulse: 68      A/P: Pregnancy at [redacted]w[redacted]d.  Doing well.   1. Routine prenatal care:  Marland Kitchen Dating reviewed, dating tab is correct . Fetal heart tones Appropriate . Fundal height within expected range.  . Infant feeding choice: Breastfeeding . Contraception choice: IUD . Infant circumcision desired no  . The patient does not have a history of Cesarean delivery and no referral to [facility] for [facility] is indicated . Influenza vaccine previously administered.   . Tdap was not given today. Patient received it at the [facility] Department. . 1 hour glucola, CBC, RPR, and HIV were obtained at the last visit.    . Rh status was reviewed and patient does not need Rhogam.  Rhogam was not given today.  . Pregnancy medical home and PHQ-9 forms were done today and reviewed.   . Childbirth and education classes were offered. . Pregnancy education regarding benefits of breastfeeding, contraception, fetal growth, expected weight gain, and safe infant sleep were discussed.  . Preterm labor and fetal movement precautions reviewed.  2. Pregnancy issues include the following and were addressed as appropriate today:  . COVID vaccination discussed, patient has received the first dose. Discussed the benefit of this to both mother and baby. Previous urine culture notable for E.coli, patient did not take treatment. Discussed importance of this and prescribed keflex 500 mg bid for 7 days with instructions given. Patient understands and agrees with the plan.  . Problem list and pregnancy box updated: Yes.   Patient scheduled in [facility] during third trimester.  Follow up 2  weeks. Return precautions discussed and provided.   Spanish video interpretation utilized throughout the entirety of this encounter.

## 2021-02-01 ENCOUNTER — Other Ambulatory Visit: Payer: Self-pay

## 2021-02-01 ENCOUNTER — Ambulatory Visit (INDEPENDENT_AMBULATORY_CARE_PROVIDER_SITE_OTHER): Payer: Self-pay | Admitting: Family Medicine

## 2021-02-01 VITALS — BP 101/70 | HR 66 | Wt 148.8 lb

## 2021-02-01 DIAGNOSIS — R8271 Bacteriuria: Secondary | ICD-10-CM

## 2021-02-01 DIAGNOSIS — Z3492 Encounter for supervision of normal pregnancy, unspecified, second trimester: Secondary | ICD-10-CM

## 2021-02-01 LAB — POCT URINALYSIS DIP (MANUAL ENTRY)
Bilirubin, UA: NEGATIVE
Blood, UA: NEGATIVE
Glucose, UA: NEGATIVE mg/dL
Ketones, POC UA: NEGATIVE mg/dL
Nitrite, UA: NEGATIVE
Protein Ur, POC: NEGATIVE mg/dL
Spec Grav, UA: 1.02 (ref 1.010–1.025)
Urobilinogen, UA: 0.2 E.U./dL
pH, UA: 7.5 (ref 5.0–8.0)

## 2021-02-01 LAB — POCT UA - MICROSCOPIC ONLY

## 2021-02-01 NOTE — Progress Notes (Signed)
  [facility] Prenatal Visit  Susan Freeman is a 30 y.o. G3P2002 at [redacted]w[redacted]d here for routine follow up. She is dated by LMP.  She reports no complaints and lesions on the outside the vagina that burn.  Hasn't had this before.  No history of herpes.  She completed abx for urine and had improvement.  She reports fetal movement. She denies vaginal bleeding, contractions, or loss of fluid.  See flow sheet for details.  Vitals:   02/01/21 0842  BP: 101/70  Pulse: 66   Physical Exam: General: In NAD Respiratory: Breathing comfortably on room air GU: Pelvic exam performed with patient supine.  Chaperone in room.  Bilateral labia without abnormalities.  No lesions noted, no TTP.   136bpm 33cm  A/P: Pregnancy at [redacted]w[redacted]d.  Doing well.   1. Routine prenatal care:  Marland Kitchen Dating reviewed, dating tab is correct . Fetal heart tones: Appropriate . Fundal height: within expected range.  . The patient does not have a history of HSV and valacyclovir is not indicated at this time.  . The patient does not have a history of Cesarean delivery and no referral to Center for [facility] is indicated . Infant feeding choice: Breastfeeding . Contraception choice: IUD . Infant circumcision desired no . Influenza vaccine previously administered.   . Tdap was not given today, received in March. Marland Kitchen COVID vaccination was discussed and patient will continue to consider.  . Childbirth and education classes were not offered. . Pregnancy education regarding benefits of breastfeeding, contraception, fetal growth, expected weight gain, and safe infant sleep were discussed.  . Preterm labor and fetal movement precautions reviewed.   2. Pregnancy issues include the following and were addressed as appropriate today: . Asymptomatic bacteria: Patient with E. coli UTI treated at last visit with Keflex.  Patient completed course of antibiotics.  Performed UA and urine culture today to ensure resolution.   She denies any urinary complaints.  Vaginal soreness: Patient first complained of vaginal sores, during vaginal exam, no sores were noted.  Patient reported that she was just feeling soreness and swelling in her vagina.  Discussed with her that this is a normal part of pregnancy.  She was reassured by this.  No need for herpes viral culture.  Patient's anatomy scan had limited view of spine.  She had a repeat scan on 2/10 that did not show any abnormalities at this time.  EFW at that time was 54th percentile.  [place] in Media. . Problem list and pregnancy box updated: Yes.   Scheduled for Ob Faculty clinic in third trimester on 4/21 for 36 week visit.  Follow up 2 weeks.

## 2021-02-01 NOTE — Patient Instructions (Signed)
Tercer trimestre de embarazo Third Trimester of Pregnancy  El tercer trimestre de embarazo va desde la semana 28 hasta la semana 40. Tambin se dice que va desde el mes 7 hasta el mes 9. En este trimestre, el beb en gestacin (feto) crece muy rpidamente. Hacia el final del noveno mes, el beb en gestacin mide alrededor de 20pulgadas (45cm) de largo. Pesa entre 6y 10libras (2,70y 4,50kg). Cambios en el cuerpo durante el tercer trimestre Su organismo contina atravesando por muchos cambios durante este perodo. Los cambios varan y generalmente vuelven a la normalidad despus del nacimiento del beb. Cambios fsicos  Seguir aumentando de peso. Puede ser que aumente entre 25 y 35libras (11 y 16kg) hacia el final del embarazo. Si tiene bajo peso, puede aumentar entre 28 y 40lb (unos 13 a 18kg). Si tiene sobrepeso, puede aumentar entre 15 y 25 libras (unos 7 a 11kg).  Podrn aparecer las primeras estras en las caderas, el vientre (abdomen) y las mamas.  Las mamas seguirn creciendo y pueden doler. Un lquido amarillo (calostro) puede salir de sus pechos. Esta es la primera leche que usted produce para el beb.  Tal vez haya cambios en el cabello.  El ombligo puede salir hacia afuera.  Puede observar que se le hinchan ms las manos, la cara o los tobillos. Cambios en la salud  Es posible que tenga acidez estomacal.  Es posible que tenga dificultades para defecar (estreimiento).  Pueden aparecerle hemorroides. Estas son venas hinchadas en el ano que pueden picar o doler.  Puede comenzar a tener venas hinchadas (vrices) en las piernas.  Puede presentar ms dolor en la pelvis, la espalda o los muslos.  Puede presentar ms hormigueo o entumecimiento en las manos, los brazos y las piernas. La piel de su vientre tambin puede sentirse entumecida.  Es posible que sienta falta de aire a medida que el tero se agranda. Otros cambios  Es posible que haga pis (orine) con mayor  frecuencia.  Puede tener ms problemas para dormir.  Puede notar que el beb en gestacin "baja" o se mueve ms hacia bajo, en el vientre.  Puede notar ms secrecin proveniente de la vagina.  Puede sentir las articulaciones flojas y puede sentir dolor alrededor del hueso plvico. Siga estas instrucciones en su casa: Medicamentos  Use los medicamentos de venta libre y los recetados solamente como se lo haya indicado el mdico. Algunos medicamentos no son seguros durante el embarazo.  Tome vitaminas prenatales que contengan por lo menos 600microgramos (mcg) de cido flico. Comida y bebida  Consuma comidas saludables que incluyan lo siguiente: ? Frutas y verduras frescas. ? Cereales integrales. ? Buenas fuentes de protenas, como carne, huevos y tofu. ? Productos lcteos con bajo contenido de grasa.  Evite la carne cruda y el jugo, la leche y el queso sin pasteurizar. Estos portan grmenes que pueden provocar dao tanto a usted como al beb.  Tome 4 o 5 comidas pequeas en lugar de 3 comidas abundantes al da.  Es posible que deba tomar medidas para prevenir o tratar los problemas para defecar: ? Beber suficiente lquido para mantener el pis (orina) de color amarillo plido. ? Come alimentos ricos en fibra. Entre ellos, frijoles, cereales integrales y frutas y verduras frescas. ? Limitar los alimentos con alto contenido de grasa y azcar. Estos incluyen alimentos fritos o dulces. Actividad  Haga ejercicios solamente como se lo haya indicado el mdico. Interrumpa la actividad fsica si comienza a tener clicos en el tero.  Evite   levantar pesos [employer].  No haga ejercicio si hace demasiado calor, hay demasiada humedad o se encuentra en un lugar de mucha altura (altitud elevada).  Si lo desea, puede continuar teniendo [employer], a menos que el mdico le indique lo contrario. Alivio del dolor y del malestar  Haga pausas con frecuencia y descanse con las piernas  levantadas (elevadas) si tiene calambres en las piernas o dolor en la parte baja de la espalda.  Dese baos de asiento con agua tibia para Best boy o las molestias causadas por las hemorroides. Use una crema para las hemorroides si el mdico la autoriza.  Use un sostn que le brinde buen soporte si sus mamas estn sensibles.  Si desarrolla venas hinchadas y abultadas en las piernas: ? Use medias de compresin segn las indicaciones de su mdico. ? Levante los pies durante 24minutos, 3 o 4veces por Training and development officer. ? Limite la sal en sus alimentos. Seguridad  Hable con el mdico antes de Control and instrumentation engineer.  No se d baos de inmersin en agua caliente, baos turcos ni saunas.  Use el cinturn de seguridad en todo momento mientras vaya en auto.  Hable con el mdico si alguien le est haciendo dao o gritando [place]. Preparacin para la llegada del beb Para prepararse para la llegada de su beb:  Tome clases prenatales.  Visite el hospital y recorra el rea de maternidad.  Compre un asiento de seguridad [employer] atrs para llevar al beb en el automvil. Aprenda cmo instalarlo en el auto.  Prepare la habitacin del beb. Saque todas las almohadas y los animales de peluche de la cuna del beb. Instrucciones generales  Evite el contacto con las bandejas sanitarias de los gatos y la tierra que estos animales usan. Estos contienen grmenes que pueden daar al beb y causar la prdida del beb ya sea aborto espontneo o muerte fetal.  No se haga duchas vaginales ni use tampones. No use tampones ni toallas higinicas perfumadas.  No fume ni consuma ningn producto que contenga nicotina o tabaco. Si necesita ayuda para dejar de fumar, consulte al mdico.  No beba alcohol.  No use medicamentos a base de hierbas, drogas ilegales, ni medicamentos que el mdico no haya autorizado. Las sustancias qumicas de estos productos pueden afectar al beb.  Cumpla con todas las  visitas de seguimiento. Esto es importante. Dnde buscar ms informacin  American Pregnancy Association (Asociacin Americana del Embarazo): americanpregnancy.org  [employer] Corporation of Obstetricians and Gynecologists (Colegio Estadounidense de Obstetras y Gineclogos): www.acog.org  Office on [employer] ([place]): KeywordPortfolios.com.br Comunquese con un mdico si:  Tiene fiebre.  Tiene clicos leves o siente presin en la parte baja del vientre.  Sufre un dolor persistente en el abdomen.  Vomita o hace deposiciones acuosas (diarrea).  Advierte lquido con mal olor que proviene de la vagina.  Siente dolor al orinar o hace orina con mal olor.  Tiene un dolor de cabeza que no desaparece despus de Teacher, adult education.  Nota cambios en la visin o ve manchas delante de los ojos. Solicite ayuda de inmediato si:  Rompe la bolsa.  Tiene contracciones regulares separadas por menos de 54minutos.  Tiene sangrado o pequeas prdidas vaginales.  Tiene clicos o dolor muy intensos en el vientre.  Tiene dificultad para respirar.  Sientes dolor en el pecho.  Se desmaya.  No ha sentido al beb moverse durante el tiempo que le indic el mdico.  Tiene dolor, hinchazn o enrojecimiento nuevos  en un brazo o una pierna o se produce un aumento de alguno de estos sntomas. Resumen  El tercer trimestre comprende desde la [employer] la [id number] (desde el mes7 hasta el mes9). Esta es la poca en que el beb en gestacin crece muy rpidamente.  Durante este perodo, las molestias pueden aumentar a medida que usted sube de peso y el beb crece.  Preprese para la llegada del beb: asista a las clases prenatales, compre un asiento de seguridad orientado hacia atrs para llevar al beb en auto y prepare la habitacin del beb.  Solicite ayuda de inmediato si tiene sangrado por la vagina, siente dolor en el pecho y tiene dificultad para respirar, o si  no ha sentido al beb moverse durante el tiempo que le indic el mdico. Esta informacin no tiene Marine scientist el consejo del mdico. Asegrese de hacerle al mdico cualquier pregunta que tenga. Document Revised: 04/27/2020 Document Reviewed: 04/27/2020 Elsevier Patient Education  [place].

## 2021-02-05 LAB — URINE CULTURE, OB REFLEX

## 2021-02-05 LAB — CULTURE, OB URINE

## 2021-02-09 ENCOUNTER — Telehealth: Payer: Self-pay

## 2021-02-09 NOTE — Telephone Encounter (Signed)
Patient returns phone call from provider regarding results. Called patient back with [name]. Informed of results per result note. Answered all questions. Provided with return precautions and reminded patient of follow up appointment.   Veronda Prude, RN

## 2021-02-16 ENCOUNTER — Other Ambulatory Visit (HOSPITAL_COMMUNITY)
Admission: RE | Admit: 2021-02-16 | Discharge: 2021-02-16 | Disposition: A | Discharge status: Home / Self Care | Payer: Self-pay | Source: Ambulatory Visit | Attending: Family Medicine | Admitting: Family Medicine

## 2021-02-16 ENCOUNTER — Other Ambulatory Visit: Payer: Self-pay

## 2021-02-16 ENCOUNTER — Ambulatory Visit (INDEPENDENT_AMBULATORY_CARE_PROVIDER_SITE_OTHER): Payer: Self-pay | Admitting: Family Medicine

## 2021-02-16 VITALS — BP 98/70 | HR 72 | Wt 151.8 lb

## 2021-02-16 DIAGNOSIS — Z3492 Encounter for supervision of normal pregnancy, unspecified, second trimester: Secondary | ICD-10-CM

## 2021-02-16 DIAGNOSIS — O99891 Other specified diseases and conditions complicating pregnancy: Secondary | ICD-10-CM

## 2021-02-16 DIAGNOSIS — R8271 Bacteriuria: Secondary | ICD-10-CM

## 2021-02-16 NOTE — Progress Notes (Signed)
[facility] Visit  Susan Freeman is a 30 y.o. G3P2002 at [redacted]w[redacted]d (via LMP c/w 22wk sono) who presents to [facility] for routine follow up. Prenatal course, history, notes, ultrasounds, and laboratory results reviewed.  In person Spanish interpretor Byrd Hesselbach used for entirety of visit.  Denies cramping/ctx, fluid leaking, vaginal bleeding, or decreased fetal movement. Taking PNV.    Primary Prenatal Care Provider: Dr Mckinley Jewel  Postpartum Plans: - delivery planning: SVD, does not want epidural - circumcision: declines - feeding: breast - pediatrician: [facility] - contraception: IUD Mirena outpatient  FHR: 156 bpm Uterine size: 35 cm  Assessment & Plan  1. Routine prenatal care: - Discussed COVID vaccination in detail today, pt declined, discussed she could change her mind at any time - GC/Chlamydia swabs collected today - Vertex on bedside sono today - GBS bacteruria, thus will need antibiotics in labor (no known drug allergies)  2. Asymptomatic bacteruria - only treated once, repeat urinalysis with <100 CFU and patient asymptomatic thus not requiring treatment, will repeat urine culture today to ensure not increasing bacterial load - as she has only been treated once her previous positive urine cultures are not recurrent bacteruria but one untreated case of bactururia, thus not meeting qualifications for antibiotic prophylaxis at this time - discussed signs/symptoms of UTI/pyelonephritis and return precautions  Next prenatal visit in 1 week. Labor & fetal movement precautions discussed.  Burley Saver, MD [facility]

## 2021-02-16 NOTE — Patient Instructions (Addendum)
Fue maravilloso verte hoy.  Por favor traiga TODOS sus medicamentos a cada visita.  Hoy hablamos de:  - F/u cita en 1 semana   Gracias por elegir Medicina familiar de [place].  Llame al [phone] si tiene alguna pregunta sobre la cita de [place].  Asegrese de programar un seguimiento en la recepcin antes de irse hoy.  Precauciones de devolucin relacionadas con Susan Freeman Los siguientes son signos/sntomas que son anormales en el Psychiatrist y pueden requerir una evaluacin adicional por parte de un mdico: Vaya a MAU en [facility] en [place] si: Tiene calambres/contracciones que no desaparecen con agua potable, especialmente si duran de 30 segundos a 1,5 minutos, aparecen y desaparecen cada 5-10 minutos durante una hora o ms, o si son cada vez ms fuertes y no puede caminar o Physiological scientist. Tu agua se rompe. A veces es un gran chorro de lquido, a veces es solo un goteo que sigue mojando tu ropa interior o corriendo por tus piernas. Tiene sangrado vaginal. No siente que su beb se mueva normalmente. Si no lo hace, busque algo para comer y beber (algo fro o algo con azcar [employer] de man o [place]) y acustese y concntrese en sentir cmo se mueve su beb. Si su beb todava no se mueve con normalidad, debe ir a MAU. Debe sentir que su beb se mueve 6 veces en una hora o 10 veces en dos horas. Tiene un dolor de cabeza persistente que no desaparece con 1 g de Tylenol, cambios en la visin, dolor en el pecho, dificultad para respirar, dolor intenso en la parte superior derecha del abdomen, empeoramiento de la hinchazn de las piernas; todos estos pueden ser signos de presin arterial alta en el embarazo y necesita para ser evaluado por un proveedor inmediatamente  [place] son preocupantes durante el Psychiatrist y, si tiene alguno de [place], le recomiendo que llame a su PCP y se presente a Furniture conservator/restorer de Admisiones de Maternidad  (mapa a continuacin) para Hotel manager.  Para cualquier emergencia relacionada con Firefighter, dirjase a la [place] de Admisiones de Maternidad en [address] de [place] y [facility]. Usar la Entrada C del hospital.   It was wonderful to see you today.  Please bring ALL of your medications with you to every visit.   Today we talked about:  - F/u appointment in 1 week   Thank you for choosing [facility].   Please call [phone] with any questions about today's appointment.  Please be sure to schedule follow up at the front  desk before you leave today.   Pregnancy Related Return Precautions The follow are signs/symptoms that are abnormal in pregnancy and may require further evaluation by a physician: Go to the MAU at [facility] at [facility] if:  You have cramping/contractions that do not go away with drinking water, especially if they are lasting 30 seconds to 1.5 minutes, coming and going every 5-10 minutes for an hour or more, or are getting stronger and you cannot walk or talk while having a contraction/cramp.  Your water breaks.  Sometimes it is a big gush of fluid, sometimes it is just a trickle that keeps getting your underwear wet or running down your legs  You have vaginal bleeding.     You do not feel your baby moving like normal.  If you do not, get something to eat and drink (  something cold or something with sugar like peanut butter or juice) and lay down and focus on feeling your baby move. If your baby is still not moving like normal, you should go to MAU. You should feel your baby move 6 times in one hour, or 10 times in two hours.  You have a persistent headache that does not go away with 1 g of Tylenol, vision changes, chest pain, difficulty breathing, severe pain in your right upper abdomen, worsening leg swelling- these can all be signs of high blood pressure in pregnancy and need to be evaluated  by a provider immediately  These are all concerning in pregnancy and if you have any of these I recommend you call your PCP and present to the Maternity Admissions Unit (map below) for further evaluation.  For any pregnancy-related emergencies, please go to the Maternity Admissions Unit in the [facility] at [facility]. You will use hospital Entrance C.     Burley Saver, MD  Family Medicine

## 2021-02-17 LAB — CERVICOVAGINAL ANCILLARY ONLY
Chlamydia: NEGATIVE
Comment: NEGATIVE
Comment: NORMAL
Neisseria Gonorrhea: NEGATIVE

## 2021-02-18 LAB — CULTURE, OB URINE

## 2021-02-18 LAB — URINE CULTURE, OB REFLEX

## 2021-02-21 ENCOUNTER — Encounter: Payer: Self-pay | Admitting: Family Medicine

## 2021-02-22 NOTE — Progress Notes (Signed)
  [facility] Prenatal Visit  Susan Freeman is a 31 y.o. G3P2002 at [redacted]w[redacted]d here for routine follow up. She is dated by LMP c/w 22wk sono.  She reports no complaints. She reports fetal movement. She denies vaginal bleeding, contractions, or loss of fluid. See flow sheet for details.  Vitals:   02/23/21 0853  BP: 102/74  Pulse: 60    A/P: Pregnancy at [redacted]w[redacted]d.  Doing well.   1. Routine prenatal care  . Dating reviewed, dating tab is correct . Fetal heart tones Appropriate HR 153 bpm  . Fundal height within expected range.  . Fetal position confirmed Vertex using Leopold's .  Checked with [place] last week  . GBS not collected today due to collected last week. .  . Repeat GC/CT not collected today due to collected last week.  and was negative . The patient does not have a history of HSV and valacyclovir is not indicated at this time.  . Infant feeding choice: Breastfeeding . Contraception choice: IUD outpatient Mirena  . Infant circumcision desired no . Influenza vaccine previously administered.   . Tdap previously administered between 27-36 weeks  . COVID vaccination was discussed and declines.  . Pregnancy education regarding preterm labor, fetal movement,  benefits of breastfeeding, contraception, fetal growth, expected weight gain, and safe infant sleep were discussed.    2. Pregnancy issues include the following and were addressed as appropriate today: - Discussed COVID vaccine, patient declined - GBS bacteruria, will need Abx in labor  - Problem list and pregnancy box updated: Yes.  Follow up 1 week.

## 2021-02-23 ENCOUNTER — Other Ambulatory Visit: Payer: Self-pay

## 2021-02-23 ENCOUNTER — Ambulatory Visit (INDEPENDENT_AMBULATORY_CARE_PROVIDER_SITE_OTHER): Payer: Self-pay | Admitting: Family Medicine

## 2021-02-23 VITALS — BP 102/74 | HR 60 | Wt 149.8 lb

## 2021-02-23 DIAGNOSIS — Z3492 Encounter for supervision of normal pregnancy, unspecified, second trimester: Secondary | ICD-10-CM

## 2021-02-23 NOTE — Patient Instructions (Signed)
It was great seeing you today! Glad to see you are doing well!  For your allergies, you can get over the counter allergy eye drop for the eye itching, and nasal spray for your nasal symptoms.   Please check-out at the front desk before leaving the clinic. I'd like to see you back in 1 week but if you need to be seen earlier than that for any new issues we're happy to fit you in, just give [place] a call!  Pregnancy Related Return Precautions The follow are signs/symptoms that are abnormal in pregnancy and may require further evaluation by a physician: Go to the MAU at [facility] at [facility] if:  You have cramping/contractions that do not go away with drinking water, especially if they are lasting 30 seconds to 1.5 minutes, coming and going every 5-10 minutes for an hour or more, or are getting stronger and you cannot walk or talk while having a contraction/cramp.  Your water breaks. Sometimes it is a big gush of fluid, sometimes it is just a trickle that keeps getting your underwear wet or running down your legs  You have vaginal bleeding.   You do not feel your baby moving like normal. If you do not, get something to eat and drink (something cold or something with sugar like peanut butter or juice) and lay down and focus on feeling your baby move. If your baby is still not moving like normal, you should go to MAU. You should feel your baby move 6 times in one hour, or 10 times in two hours.  You have a persistent headache that does not go away with 1 g of Tylenol, vision changes, chest pain, difficulty breathing, severe pain in your right upper abdomen, worsening leg swelling- these can all be signs of high blood pressure in pregnancy and need to be evaluated by a provider immediately  These are all concerning in pregnancy and if you have any of these I recommend you call your PCP and present to the Maternity Admissions Unit (map below) for further evaluation.  For any  pregnancy-related emergencies, please go to the Maternity Admissions Unit in the [facility] at [facility]. You will use hospital Entrance C.     Feel free to call with any questions or concerns at any time, at [phone].   Take care,  Dr. Cora Collum [facility]

## 2021-03-02 NOTE — Progress Notes (Deleted)
   [facility] Prenatal Visit  Susan Freeman is a 30 y.o. G3P2002 at [redacted]w[redacted]d here for routine follow up. She is dated by {Ob dating:14516}.  She reports {symptoms; pregnancy related:14538}. She reports fetal movement. She denies vaginal bleeding, contractions, or loss of fluid. See flow sheet for details.  There were no vitals filed for this visit.  A/P: Pregnancy at [redacted]w[redacted]d.  Doing well.   1. Routine prenatal care  . Dating reviewed, dating tab is {correct:23336::"correct"} . Fetal heart tones {appropriate:23337} . Fundal height {fundal height:23342::"within expected range. "} . Fetal position confirmed {vertex:23350::"Vertex"} using {ultrasound or Leopolds:23351::"Ultrasound "}.  Marland Kitchen GBS {collected today :23349::"collected today. "}.  . Repeat GC/CT {collected today :23349::"collected today. "} . The patient {does/does not:200015::"does not"} have a history of HSV and valacyclovir {ACTION; IS/IS NOT:21021397::"is not"} at this time.  . Infant feeding choice: {Breasfeeding:23347::"Breastfeeding"} . Contraception choice: {postpartum contraception:23348} . Infant circumcision desired {Response; yes/no/na:63} . Influenza vaccine {given:23340}  . Tdap {Tdap:23345::"previously administered between 27-36 weeks "} . Pregnancy education regarding preterm labor, fetal movement,  benefits of breastfeeding, contraception, fetal growth, expected weight gain, and safe infant sleep were discussed.    2. Pregnancy issues include the following and were addressed as appropriate today:  . *** . Problem list and pregnancy box updated: {yes/no:20286::"Yes"}.  Follow up 1 week.

## 2021-03-03 ENCOUNTER — Ambulatory Visit (INDEPENDENT_AMBULATORY_CARE_PROVIDER_SITE_OTHER): Payer: Self-pay | Admitting: Family Medicine

## 2021-03-03 ENCOUNTER — Other Ambulatory Visit: Payer: Self-pay

## 2021-03-03 VITALS — BP 100/60 | HR 91 | Wt 150.1 lb

## 2021-03-03 DIAGNOSIS — Z3480 Encounter for supervision of other normal pregnancy, unspecified trimester: Secondary | ICD-10-CM

## 2021-03-03 DIAGNOSIS — Z3492 Encounter for supervision of normal pregnancy, unspecified, second trimester: Secondary | ICD-10-CM

## 2021-03-03 NOTE — Patient Instructions (Addendum)
It was great seeing you today!   I'd like to see you back 1 week but if you need to be seen earlier than that for any new issues we're happy to fit you in, just give [place] a call!   If you have questions or concerns please do not hesitate to call at [phone].  Dr. Katherina [facility]

## 2021-03-03 NOTE — Progress Notes (Signed)
   [facility] Prenatal Visit  Lekisha Mcghee is a 30 y.o. [id number] [redacted]w[redacted]d here for routine follow up. She is dated by LMP.  She reports heartburn. She reports fetal movement. She denies vaginal bleeding, contractions, or loss of fluid. See flow sheet for details.     A/P: Pregnancy at [redacted]w[redacted]d.  Doing well.   1. Routine prenatal care  . Dating reviewed, dating tab is correct . Fetal heart tones Appropriate . Fundal height within expected range.  . Fetal position confirmed Vertex using Leopold's .  Marland Kitchen GBS not collected today due to being previously collected. GBS bacteruria.  . Repeat GC/CT not collected today due to previous collection on 02/16/21.  . The patient does not have a history of HSV and valacyclovir is not indicated at this time.  . Infant feeding choice: Breastfeeding . Contraception choice: IUD (Mirena)  . Infant circumcision desired no . Influenza vaccine previously administered.   . Tdap previously administered between 27-36 weeks  . COVID vaccination was discussed and pt declined.  . Pregnancy education regarding preterm labor, fetal movement,  benefits of breastfeeding, contraception, fetal growth, expected weight gain, and safe infant sleep were discussed.    2. Pregnancy issues include the following and were addressed as appropriate today:   GBS Bacteruria pregnancy - will need antibiotics in labor   Declines COVID vaccine. Patient has no questions regarding the vaccine.  Problem list and pregnancy box updated: Yes.   Follow up 1 week.

## 2021-03-04 NOTE — Progress Notes (Deleted)
   SUBJECTIVE:   CHIEF COMPLAINT / HPI:   Chief Complaint  Patient presents with  . Routine Prenatal Visit     Susan Freeman is a 30 y.o. female here for ***   Pt reports ***    PERTINENT  PMH / PSH: reviewed and updated as appropriate   OBJECTIVE:   BP 100/60   Pulse 91   Wt 150 lb 2 oz (68.1 kg)   LMP 06/12/2020 (Exact Date)   BMI 31.38 kg/m   ***  ASSESSMENT/PLAN:   No problem-specific Assessment & Plan notes found for this encounter.     Katha Cabal, DO PGY-2, [facility] 03/04/2021      {    This will disappear when note is signed, click to select method of visit    :1}

## 2021-03-12 NOTE — Progress Notes (Signed)
  [facility] Prenatal Visit  Susan Freeman is a 30 y.o. G3P2002 at [redacted]w[redacted]d here for routine follow up. She is dated by LMP - [facility] 03/19/2021.  She reports occasional sharp pain in low abdomen. She reports fetal movement. She denies vaginal bleeding, contractions, or loss of fluid. See flow sheet for details.  Vitals:   03/13/21 1109  BP: 100/80  Pulse: 61    A/P: Pregnancy at [redacted]w[redacted]d.  Doing well.   1. Routine prenatal care:  Marland Kitchen Dating reviewed, dating tab is correct . Fetal heart tones Appropriate - 153 bpm . Fundal height within expected range.  - 39cm . Fetal position previously confirmed Vertex using Ultrasound .  Marland Kitchen Infant feeding choice: Breastfeeding . Contraception choice: IUD . Infant circumcision desired: no . Influenza vaccine previously administered.   . Tdap previously administered between 27-36 weeks  . GBS and gc/chlamydia testing results were not reviewed today - GBS bacteruria . Pregnancy education regarding labor, fetal movement,  benefits of breastfeeding, contraception, and safe infant sleep were discussed.  . Labor and fetal movement precautions reviewed. . Induction of labor discussed. Scheduled for induction at approximately 41 weeks. BPP scheduled between 40-41 weeks.   2. Pregnancy issues include the following and were addressed as appropriate today: . Problem list and pregnancy box updated: Yes.   Follow up 1 week.   Peggyann Shoals, DO [facility], PGY-3 03/14/2021 9:29 PM

## 2021-03-13 ENCOUNTER — Other Ambulatory Visit: Payer: Self-pay

## 2021-03-13 ENCOUNTER — Ambulatory Visit (INDEPENDENT_AMBULATORY_CARE_PROVIDER_SITE_OTHER): Payer: Self-pay | Admitting: Family Medicine

## 2021-03-13 VITALS — BP 100/80 | HR 61 | Wt 152.0 lb

## 2021-03-13 DIAGNOSIS — Z3A39 39 weeks gestation of pregnancy: Secondary | ICD-10-CM

## 2021-03-13 DIAGNOSIS — Z3493 Encounter for supervision of normal pregnancy, unspecified, third trimester: Secondary | ICD-10-CM

## 2021-03-13 NOTE — Progress Notes (Signed)
Contacted the language line to help me translate. We contacted the pt to let her know about her BPP appt. Pt did not answer. Interpreter left a vm.  Interpreter: Karena Addison [phone]

## 2021-03-13 NOTE — Patient Instructions (Signed)
Thank you for coming in to see [place] today! Please see below to review our plan for today's visit:     [address] [place], [place] [address]  Please call the clinic at [phone] if your symptoms worsen or you have any concerns. It was our pleasure to serve you!   Dr. Peggyann Shoals [facility]

## 2021-03-14 ENCOUNTER — Telehealth (HOSPITAL_COMMUNITY): Payer: Self-pay | Admitting: *Deleted

## 2021-03-14 ENCOUNTER — Encounter (HOSPITAL_COMMUNITY): Payer: Self-pay | Admitting: *Deleted

## 2021-03-14 NOTE — Telephone Encounter (Signed)
Preadmission screen [phone]

## 2021-03-15 ENCOUNTER — Encounter (HOSPITAL_COMMUNITY): Payer: Self-pay | Admitting: Obstetrics & Gynecology

## 2021-03-15 ENCOUNTER — Other Ambulatory Visit: Payer: Self-pay

## 2021-03-15 ENCOUNTER — Inpatient Hospital Stay (HOSPITAL_COMMUNITY)
Admission: AD | Admit: 2021-03-15 | Discharge: 2021-03-17 | DRG: 807 | Disposition: A | Discharge status: Home / Self Care | Payer: Medicaid Other | Attending: Obstetrics & Gynecology | Admitting: Obstetrics & Gynecology

## 2021-03-15 DIAGNOSIS — Z3A00 Weeks of gestation of pregnancy not specified: Secondary | ICD-10-CM

## 2021-03-15 DIAGNOSIS — O329XX0 Maternal care for malpresentation of fetus, unspecified, not applicable or unspecified: Secondary | ICD-10-CM

## 2021-03-15 DIAGNOSIS — Z20822 Contact with and (suspected) exposure to covid-19: Secondary | ICD-10-CM | POA: Diagnosis present

## 2021-03-15 DIAGNOSIS — O99824 Streptococcus B carrier state complicating childbirth: Secondary | ICD-10-CM | POA: Diagnosis present

## 2021-03-15 DIAGNOSIS — Z3493 Encounter for supervision of normal pregnancy, unspecified, third trimester: Secondary | ICD-10-CM

## 2021-03-15 DIAGNOSIS — Z3A39 39 weeks gestation of pregnancy: Secondary | ICD-10-CM

## 2021-03-15 DIAGNOSIS — O99355 Diseases of the nervous system complicating the puerperium: Secondary | ICD-10-CM | POA: Diagnosis not present

## 2021-03-15 DIAGNOSIS — O26893 Other specified pregnancy related conditions, third trimester: Secondary | ICD-10-CM | POA: Diagnosis present

## 2021-03-15 DIAGNOSIS — G8918 Other acute postprocedural pain: Secondary | ICD-10-CM | POA: Diagnosis not present

## 2021-03-15 LAB — CBC
HCT: 41.6 % (ref 36.0–46.0)
Hemoglobin: 13.9 g/dL (ref 12.0–15.0)
MCH: 29 pg (ref 26.0–34.0)
MCHC: 33.4 g/dL (ref 30.0–36.0)
MCV: 86.7 fL (ref 80.0–100.0)
Platelets: 235 10*3/uL (ref 150–400)
RBC: 4.8 MIL/uL (ref 3.87–5.11)
RDW: 14.1 % (ref 11.5–15.5)
WBC: 10.9 10*3/uL — ABNORMAL HIGH (ref 4.0–10.5)
nRBC: 0 % (ref 0.0–0.2)

## 2021-03-15 LAB — TYPE AND SCREEN
ABO/RH(D): B POS
Antibody Screen: NEGATIVE

## 2021-03-15 LAB — RESP PANEL BY RT-PCR (FLU A&B, COVID) ARPGX2
Influenza A by PCR: NEGATIVE
Influenza B by PCR: NEGATIVE
SARS Coronavirus 2 by RT PCR: NEGATIVE

## 2021-03-15 MED ORDER — OXYTOCIN-SODIUM CHLORIDE 30-0.9 UT/500ML-% IV SOLN
2.5000 [IU]/h | INTRAVENOUS | Status: DC
  Filled 2021-03-15: qty 500

## 2021-03-15 MED ORDER — ACETAMINOPHEN 325 MG PO TABS: 650.0000 mg | ORAL_TABLET | ORAL | Status: DC | PRN

## 2021-03-15 MED ORDER — SIMETHICONE 80 MG PO CHEW: 80.0000 mg | CHEWABLE_TABLET | ORAL | Status: DC | PRN

## 2021-03-15 MED ORDER — FENTANYL CITRATE (PF) 100 MCG/2ML IJ SOLN
100.0000 ug | INTRAMUSCULAR | Status: DC | PRN
  Administered 2021-03-15 (×2): 100 ug via INTRAVENOUS
  Filled 2021-03-15: qty 2

## 2021-03-15 MED ORDER — SODIUM CHLORIDE 0.9 % IV SOLN
2.0000 g | Freq: Once | INTRAVENOUS | Status: AC
  Administered 2021-03-15: 2 g via INTRAVENOUS
  Filled 2021-03-15: qty 2000

## 2021-03-15 MED ORDER — LIDOCAINE HCL (PF) 1 % IJ SOLN: 30.0000 mL | INTRAMUSCULAR | Status: DC | PRN

## 2021-03-15 MED ORDER — TETANUS-DIPHTH-ACELL PERTUSSIS 5-2.5-18.5 LF-MCG/0.5 IM SUSY: 0.5000 mL | PREFILLED_SYRINGE | Freq: Once | INTRAMUSCULAR | Status: DC

## 2021-03-15 MED ORDER — OXYCODONE-ACETAMINOPHEN 5-325 MG PO TABS: 1.0000 | ORAL_TABLET | ORAL | Status: DC | PRN

## 2021-03-15 MED ORDER — ONDANSETRON HCL 4 MG/2ML IJ SOLN: 4.0000 mg | Freq: Four times a day (QID) | INTRAMUSCULAR | Status: DC | PRN

## 2021-03-15 MED ORDER — ONDANSETRON HCL 4 MG/2ML IJ SOLN: 4.0000 mg | INTRAMUSCULAR | Status: DC | PRN

## 2021-03-15 MED ORDER — SODIUM CHLORIDE 0.9 % IV SOLN: 1.0000 g | INTRAVENOUS | Status: DC

## 2021-03-15 MED ORDER — DIBUCAINE (PERIANAL) 1 % EX OINT: 1.0000 "application " | TOPICAL_OINTMENT | CUTANEOUS | Status: DC | PRN

## 2021-03-15 MED ORDER — LACTATED RINGERS IV SOLN: 500.0000 mL | INTRAVENOUS | Status: DC | PRN

## 2021-03-15 MED ORDER — FENTANYL CITRATE (PF) 100 MCG/2ML IJ SOLN
INTRAMUSCULAR | Status: AC
  Filled 2021-03-15: qty 2

## 2021-03-15 MED ORDER — DIPHENHYDRAMINE HCL 25 MG PO CAPS: 25.0000 mg | ORAL_CAPSULE | Freq: Four times a day (QID) | ORAL | Status: DC | PRN

## 2021-03-15 MED ORDER — OXYTOCIN BOLUS FROM INFUSION
333.0000 mL | Freq: Once | INTRAVENOUS | Status: AC
Start: 2021-03-15 — End: 2021-03-15
  Administered 2021-03-15: 333 mL via INTRAVENOUS

## 2021-03-15 MED ORDER — ONDANSETRON HCL 4 MG PO TABS
4.0000 mg | ORAL_TABLET | ORAL | Status: DC | PRN
Start: 2021-03-15 — End: 2021-03-17

## 2021-03-15 MED ORDER — IBUPROFEN 600 MG PO TABS
600.0000 mg | ORAL_TABLET | Freq: Four times a day (QID) | ORAL | Status: DC
  Administered 2021-03-15 – 2021-03-17 (×8): 600 mg via ORAL
  Filled 2021-03-15 (×8): qty 1

## 2021-03-15 MED ORDER — METHYLERGONOVINE MALEATE 0.2 MG/ML IJ SOLN: 0.2000 mg | Freq: Once | INTRAMUSCULAR | Status: DC

## 2021-03-15 MED ORDER — OXYCODONE-ACETAMINOPHEN 5-325 MG PO TABS
2.0000 | ORAL_TABLET | ORAL | Status: DC | PRN
Start: 2021-03-15 — End: 2021-03-15

## 2021-03-15 MED ORDER — ACETAMINOPHEN 325 MG PO TABS
650.0000 mg | ORAL_TABLET | ORAL | Status: DC | PRN
Start: 2021-03-15 — End: 2021-03-17
  Administered 2021-03-15 – 2021-03-16 (×2): 650 mg via ORAL
  Filled 2021-03-15 (×3): qty 2

## 2021-03-15 MED ORDER — WITCH HAZEL-GLYCERIN EX PADS: 1.0000 "application " | MEDICATED_PAD | CUTANEOUS | Status: DC | PRN

## 2021-03-15 MED ORDER — COCONUT OIL OIL: 1.0000 "application " | TOPICAL_OIL | Status: DC | PRN

## 2021-03-15 MED ORDER — SOD CITRATE-CITRIC ACID 500-334 MG/5ML PO SOLN: 30.0000 mL | ORAL | Status: DC | PRN

## 2021-03-15 MED ORDER — METHYLERGONOVINE MALEATE 0.2 MG/ML IJ SOLN
INTRAMUSCULAR | Status: AC
  Filled 2021-03-15: qty 1

## 2021-03-15 MED ORDER — PRENATAL MULTIVITAMIN CH
1.0000 | ORAL_TABLET | Freq: Every day | ORAL | Status: DC
  Administered 2021-03-16 – 2021-03-17 (×2): 1 via ORAL
  Filled 2021-03-15 (×2): qty 1

## 2021-03-15 MED ORDER — BENZOCAINE-MENTHOL 20-0.5 % EX AERO: 1.0000 "application " | INHALATION_SPRAY | CUTANEOUS | Status: DC | PRN

## 2021-03-15 MED ORDER — LACTATED RINGERS IV SOLN: INTRAVENOUS | Status: DC

## 2021-03-15 MED ORDER — SENNOSIDES-DOCUSATE SODIUM 8.6-50 MG PO TABS
2.0000 | ORAL_TABLET | Freq: Every day | ORAL | Status: DC
  Administered 2021-03-17: 2 via ORAL
  Filled 2021-03-15 (×2): qty 2

## 2021-03-15 MED ORDER — METHYLERGONOVINE MALEATE 0.2 MG/ML IJ SOLN
0.2000 mg | Freq: Once | INTRAMUSCULAR | Status: AC
  Administered 2021-03-15: 0.2 mg via INTRAMUSCULAR

## 2021-03-15 NOTE — Lactation Note (Signed)
This note was copied from a baby's chart. Lactation Consultation Note  Patient Name: Susan Freeman [id number] Date: 03/15/2021 Reason for consult: L&D Initial assessment Age:29 hours  L&D consult with 56 minutes old infant and P3 mother. Parents are present at time of consult. Congratulated them on their newborn. Infant is latched to left breast, laid back position upon arrival. Noted suckling and swallowing. Discussed STS as ideal transition for infants after birth helping with temperature, blood sugar and comfort. Talked about primal reflexes such as rooting, hands to mouth, searching for the breast among others.  Explained LC services availability during postpartum stay. Thanked family for their time.   Maternal Data Does the patient have breastfeeding experience prior to this delivery?: Yes How long did the patient breastfeed?: 47-months x1 and 25-months x1  Feeding Mother's Current Feeding Choice: Breast Milk  LATCH Score Latch: Grasps breast easily, tongue down, lips flanged, rhythmical sucking.  Audible Swallowing: Spontaneous and intermittent  Type of Nipple: Everted at rest and after stimulation  Comfort (Breast/Nipple): Soft / non-tender  Hold (Positioning): No assistance needed to correctly position infant at breast.  LATCH Score: 10   Lactation Tools Discussed/Used    Interventions Interventions: Breast feeding basics reviewed;Skin to skin;Education  Discharge    Consult Status Consult Status: Follow-up Date: 03/15/21 Follow-up type: In-patient    Susan Freeman 03/15/2021, 3:06 PM

## 2021-03-15 NOTE — H&P (Signed)
OBSTETRIC ADMISSION HISTORY AND PHYSICAL  Charlene Detter is a 30 y.o. female G3P2002 with IUP at [redacted]w[redacted]d by LMP presenting for active labor. Reports contractions started at 0315 this morning and are now every 5-6 mins apart. She denies LOF, vaginal bleeding, headaches, vision changes, or RUQ pain. Endorses active fetal movement. She plans on breast feeding. She request IUD for birth control. She received her prenatal [facility] at [facility]   Dating: By LMP --->  Estimated Date of Delivery: 03/19/21  Sono:   @[redacted]w[redacted]d , CWD, normal anatomy, cephalic presentation, 867g, 54% EFW   Prenatal History/Complications:  1. GBS bacteruria 2. Language barrier  Past Medical History: History reviewed. No pertinent past medical history.  Past Surgical History: Past Surgical History:  Procedure Laterality Date  . NO PAST SURGERIES      Obstetrical History: OB History    Gravida  3   Para  2   Term  2   Preterm      AB      Living  2     SAB      IAB      Ectopic      Multiple  0   Live Births  2          Social History Social History   Socioeconomic History  . Marital status: Significant Other    Spouse name: Not on file  . Number of children: Not on file  . Years of education: Not on file  . Highest education level: Not on file  Occupational History  . Not on file  Tobacco Use  . Smoking status: Never Smoker  . Smokeless tobacco: Never Used  Vaping Use  . Vaping Use: Never used  Substance and Sexual Activity  . Alcohol use: No  . Drug use: No  . Sexual activity: Yes    Birth control/protection: I.U.D.  Other Topics Concern  . Not on file  Social History Narrative  . Not on file   Social Determinants of Health   Financial Resource Strain: Not on file  Food Insecurity: Not on file  Transportation Needs: Not on file  Physical Activity: Not on file  Stress: Not on file  Social Connections: Not on file    Family History: Family History   Problem Relation Age of Onset  . Hypertension Mother   . Healthy Father     Allergies: No Known Allergies  Medications Prior to Admission  Medication Sig Dispense Refill Last Dose  . Prenatal Vit-Fe Fumarate-FA (MULTIVITAMIN-PRENATAL) 27-0.8 MG TABS tablet Take 1 tablet by mouth daily at 12 noon.   03/14/2021 at Unknown time   Review of Systems   All systems reviewed and negative except as stated in HPI  Blood pressure 120/84, pulse 66, temperature 98.4 F (36.9 C), temperature source Oral, resp. rate 18, last menstrual period 06/12/2020, SpO2 100 %, currently breastfeeding. General appearance: alert and moderate distress Lungs: effort normal Heart: regular rate and rhythm Abdomen: soft, non-tender  Extremities: trace edema  Presentation: cephalic confirmed by BSUS by MAU provider Fetal monitoring: 135 bpm, moderate variability, +accels, no decels Uterine activity: Q 5-18mins Dilation: 7 Effacement (%): 60 Station: Ballotable Exam by:: jolynn  Prenatal labs: ABO, Rh: B/Positive/-- (10/25 1133) Antibody: Negative (10/25 1133) Rubella: 5.86 (10/25 1133) RPR: Non Reactive (03/04 1128)  HBsAg: Negative (10/25 1133)  HIV: Non Reactive (03/04 1128)  GBS: positive  1 hr Glucola: 117 Genetic screening: declined Anatomy 07[birth year]1987: normal  Prenatal Transfer Tool  Maternal Diabetes:  No Genetic Screening: Declined Maternal Ultrasounds/Referrals: Normal Fetal Ultrasounds or other Referrals:  None Maternal Substance Abuse:  No Significant Maternal Medications:  None Significant Maternal Lab Results: Group B Strep positive  Results for orders placed or performed during the hospital encounter of 03/15/21 (from the past 24 hour(s))  Type and screen [facility]   Collection Time: 03/15/21 10:20 AM  Result Value Ref Range   ABO/RH(D) PENDING    Antibody Screen PENDING    Sample Expiration      03/18/2021,2359 Performed at [facility], [address],  [place], [place] [address]     Patient Active Problem List   Diagnosis Date Noted  . Bacteriuria during pregnancy 02/16/2021  . Supervision of low-risk pregnancy, second trimester 10/04/2020  . GBS bacteriuria 09/02/2020    Assessment/Plan:  Ikram Riebe is a 30 y.o. G3P2002 at [redacted]w[redacted]d here for active labor  #Labor: expectant management #Pain: IV pain medication prn; pt declines epidural #FWB: Category 1 #ID: GBS positive; Ampicillin ordered #MOF: breast #MOC: IUD (outpatient) #Circ: declines  Camelia Eng, MSN, CNM  03/15/2021, 11:06 AM

## 2021-03-15 NOTE — MAU Note (Signed)
Contractions started at 0300, coming every 5 min.  3rd baby, 2 prior vag deliveries- no complications.  No problem with pregnancy.  Denies bleeding or ROM. No recent exam.

## 2021-03-15 NOTE — Discharge Summary (Signed)
Postpartum Discharge Summary      Patient Name: Susan Freeman DOB: 29/01/1991 MRN: [id number]  Date of admission: 03/15/2021 Delivery date:03/15/2021  Delivering provider: Renee Harder  Date of discharge: 03/17/21  Admitting diagnosis: Supervision of low-risk pregnancy, third trimester [Z34.93] Intrauterine pregnancy: [redacted]w[redacted]d    Secondary diagnosis:  Active Problems:   Supervision of low-risk pregnancy, third trimester   SVD (spontaneous vaginal delivery)  Additional problems: none    Discharge diagnosis: Term Pregnancy Delivered                                              Post partum procedures:none Augmentation: AROM Complications: None  Hospital course: Onset of Labor With Vaginal Delivery      30y.o. yo [id number] 332w3das admitted in Active Labor on 03/15/2021. Patient had an uncomplicated labor course as follows:  Membrane Rupture Time/Date: 1:51 PM ,03/15/2021   Delivery Method:Vaginal, Spontaneous  Episiotomy: None  Lacerations:  None  Patient had an uncomplicated postpartum course.  She is ambulating, tolerating a regular diet, passing flatus, and urinating well. Patient is discharged home in stable condition on 03/17/21.  Newborn Data: Birth date:03/15/2021  Birth time:2:11 PM  Gender:Female  Living status:Living  Apgars:9 ,9  Weight:3120 g   Magnesium Sulfate received: No BMZ received: No Rhophylac:No MMR:N/A, immune T-DaP:Given prenatally Flu: Yes Transfusion:No  Physical exam  Vitals:   03/15/21 2338 03/16/21 0327 03/16/21 1444 03/16/21 2203  BP: (!) 96/59 (!) 91/55 (!) 92/46 (!) 92/54  Pulse: 78 62 71 69  Resp: 16 19 16 20   Temp: 99 F (37.2 C) 98.4 F (36.9 C) 98.4 F (36.9 C) 98.2 F (36.8 C)  TempSrc: Oral Oral Oral Oral  SpO2:    98%   General: alert, cooperative and no distress Lochia: appropriate Uterine Fundus: firm Incision: N/A DVT Evaluation: No evidence of DVT seen on physical exam. Negative Homan's sign. No  cords or calf tenderness. Labs: Lab Results  Component Value Date   WBC 10.9 (H) 03/15/2021   HGB 13.9 03/15/2021   HCT 41.6 03/15/2021   MCV 86.7 03/15/2021   PLT 235 03/15/2021   CMP Latest Ref Rng & Units 08/29/2012  Glucose 70 - 99 mg/dL 88  BUN 6 - 23 mg/dL 9  Creatinine 0.50 - 1.10 mg/dL 0.52  Sodium 135 - 145 mEq/L 136  Potassium 3.5 - 5.1 mEq/L 3.8  Chloride 96 - 112 mEq/L 102  CO2 19 - 32 mEq/L 27  Calcium 8.4 - 10.5 mg/dL 8.7  Total Protein 6.0 - 8.3 g/dL 7.0  Total Bilirubin 0.3 - 1.2 mg/dL 0.4  Alkaline Phos 39 - 117 U/L 63  AST 0 - 37 U/L 20  ALT 0 - 35 U/L 10   Edinburgh Score: Edinburgh Postnatal Depression Scale Screening Tool 03/16/2021  I have been able to laugh and see the funny side of things. 1  I have looked forward with enjoyment to things. 0  I have blamed myself unnecessarily when things went wrong. 0  I have been anxious or worried for no good reason. 0  I have felt scared or panicky for no good reason. 0  Things have been getting on top of me. 0  I have been so unhappy that I have had difficulty sleeping. 0  I have felt sad or miserable. 0  I have been  so unhappy that I have been crying. 0  The thought of harming myself has occurred to me. 0  Edinburgh Postnatal Depression Scale Total 1     After visit meds:  Allergies as of 03/17/2021   No Known Allergies     Medication List    TAKE these medications   ibuprofen 600 MG tablet Commonly known as: ADVIL Take 1 tablet (600 mg total) by mouth every 6 (six) hours.   multivitamin-prenatal 27-0.8 MG Tabs tablet Take 1 tablet by mouth daily at 12 noon.        Discharge home in stable condition Infant Feeding: Bottle and Breast Infant Disposition:home with mother Discharge instruction: per After Visit Summary and Postpartum booklet. Activity: Advance as tolerated. Pelvic rest for 6 weeks.  Diet: routine diet Future Appointments: Future Appointments  Date Time Provider [facility]  03/22/2021  8:50 AM Wilber Oliphant, MD [facility]  03/22/2021  3:15 PM [facility] N[facility]  03/24/2021 10:05 AM [facility]-SCREENING [facility] None   Follow up Visit:  [place]. Schedule an appointment as soon as possible for a visit in 6 week(s).   Specialty: Family Medicine Why: for post partum checkup Contact information: [address] [id number] [place] [phone]               Please schedule this patient for a In person postpartum visit in 6 weeks with the following provider: Any provider. Additional Postpartum F/U: Low risk pregnancy complicated by: none Delivery mode:  Vaginal, Spontaneous  Anticipated Birth Control:  IUD   03/17/2021 Christin Fudge, CNM

## 2021-03-15 NOTE — Progress Notes (Signed)
Susan Freeman is a 30 y.o. G3P2002 at [redacted]w[redacted]d admitted for active labor.  Subjective: Reporting more painful contractions. IV pain medication not helping, but continues to decline epidural.   Objective: BP 118/78   Pulse 62   Temp 98.4 F (36.9 C) (Oral)   Resp 18   LMP 06/12/2020 (Exact Date)   SpO2 100%  No intake/output data recorded. No intake/output data recorded.  FHT:  FHR: 130 bpm, variability: moderate,  accelerations:  Present,  decelerations:  Absent UC: Q 4-6 mins SVE:   Dilation: 8 Effacement (%): 80 Station: -3 Exam by:: m wilkins rnc  Labs: Lab Results  Component Value Date   WBC 10.9 (H) 03/15/2021   HGB 13.9 03/15/2021   HCT 41.6 03/15/2021   MCV 86.7 03/15/2021   PLT 235 03/15/2021    Assessment / Plan: Susan Freeman 29 y.o. [id number] at [redacted]w[redacted]d  Labor: progressing, continue expectant management; Pitocin/AROM prn Fetal Wellbeing:  Category I Pain Control:  IV pain meds I/D:  GBS positive, recieved Ampicillin Anticipated MOD:  NSVD  Brand Males, MSN, CNM 03/15/2021, 1:26 PM

## 2021-03-15 NOTE — Progress Notes (Signed)
Cephalic presentation confirmed with BSUS.  Clayton Bibles, MSN, CNM Certified Nurse Midwife, [employer] for [employer], [facility] 03/15/21 10:07 AM

## 2021-03-16 LAB — RPR: RPR Ser Ql: NONREACTIVE

## 2021-03-16 MED ORDER — OXYCODONE HCL 5 MG PO TABS: 5.0000 mg | ORAL_TABLET | Freq: Four times a day (QID) | ORAL | Status: DC | PRN

## 2021-03-16 MED ORDER — POLYETHYLENE GLYCOL 3350 17 G PO PACK
17.0000 g | PACK | Freq: Every day | ORAL | Status: DC
  Administered 2021-03-16 – 2021-03-17 (×2): 17 g via ORAL
  Filled 2021-03-16 (×2): qty 1

## 2021-03-16 MED ORDER — OXYCODONE HCL 5 MG PO TABS: 5.0000 mg | ORAL_TABLET | ORAL | Status: DC | PRN

## 2021-03-16 NOTE — Progress Notes (Signed)
After I called pt to check on her needs and meals she states not need for any interpretation services, by Orlan Leavens Spanish Medical Interpreter.

## 2021-03-16 NOTE — Progress Notes (Addendum)
POSTPARTUM PROGRESS NOTE  Post Partum Day 1  Subjective:  Susan Freeman is a 30 y.o. [id number] s/p SVD at [redacted]w[redacted]d.  No acute events overnight.  Pt denies problems with ambulating, voiding or po intake.  She denies nausea or vomiting.  Pain is poorly controlled, asking for something stronger.  She has not had flatus. She has not had bowel movement.  Lochia Minimal.   Objective: Blood pressure (!) 91/55, pulse 62, temperature 98.4 F (36.9 C), temperature source Oral, resp. rate 19, last menstrual period 06/12/2020, SpO2 98 %, currently breastfeeding.  Physical Exam:  General: alert, cooperative and no distress Chest: no respiratory distress Heart:regular rate Abdomen: soft, nontender,  Uterine Fundus: firm, appropriately tender DVT Evaluation: No calf swelling or tenderness Extremities: no edema Skin: warm, dry  Recent Labs    03/15/21 1025  HGB 13.9  HCT 41.6    Assessment/Plan: Susan Freeman is a 30 y.o. [id number] s/p SVD at [redacted]w[redacted]d   PPD#1 - Doing well Contraception: OP IUD Feeding: breast Dispo: Plan for discharge likely tomorrow per patient preference.   LOS: 1 day   Susan Deeds, MD,   03/16/2021, 8:27 AM     I saw and evaluated the patient. I agree with the findings and the plan of care as documented in the resident's note.  Casper Harrison, MD [facility] Fellow, [facility] for [facility], [facility]

## 2021-03-17 DIAGNOSIS — O99355 Diseases of the nervous system complicating the puerperium: Secondary | ICD-10-CM

## 2021-03-17 DIAGNOSIS — G8918 Other acute postprocedural pain: Secondary | ICD-10-CM

## 2021-03-17 MED ORDER — IBUPROFEN 600 MG PO TABS: 600.0000 mg | ORAL_TABLET | Freq: Four times a day (QID) | ORAL | 0 refills | Status: AC

## 2021-03-17 NOTE — Discharge Instructions (Signed)
Cuidados en el postparto luego de un parto por cesrea Postpartum Care After Cesarean Delivery Lea esta informacin sobre cmo cuidarse desde el momento en que nazca su beb y Susan Freeman 6 a 12 semanas despus del parto (perodo del posparto). El mdico tambin podr darle indicaciones ms especficas. Comunquese con el mdico si tiene problemas o preguntas. Siga estas indicaciones en su casa: Medicamentos  [employer] de venta libre y los recetados solamente como se lo haya indicado el mdico.  Si le recetaron un antibitico, tmelo como se lo haya indicado el mdico. No deje de tomar el antibitico aunque comience a sentirse mejor.  Pregntele al mdico si el medicamento recetado: ? Hace que sea necesario que evite conducir o usar maquinaria pesada. ? Puede causarle estreimiento. Es posible que deba tomar medidas para prevenir o tratar el estreimiento, por ejemplo:  Beber suficiente lquido como para Pharmacologist la orina de color amarillo plido.  Tomar medicamentos recetados o de [employer].  Consumir alimentos ricos en fibra, como frijoles, cereales integrales, y frutas y verduras frescas.  Limitar su consumo de alimentos ricos en grasa y azcares procesados, como los alimentos fritos o dulces. Actividad  Retome sus actividades normales de a poco segn lo indicado por el mdico.  Evite las actividades que demandan mucho esfuerzo y Engineer, drilling (que son extenuantes) hasta que el mdico se lo autorice. Siempre es ms seguro caminar a un ritmo tranquilo a moderado. Pregntele al mdico qu actividades son seguras para usted. ? No levante objetos que pesen ms que su beb o ms de 10 libras (4,5 kg), como se lo haya indicado el mdico. ? No pase la aspiradora, suba escaleras ni conduzca un vehculo durante el tiempo que le indique el mdico.  De ser posible, pdale a alguien que le brinde ayuda con las tareas domsticas hasta que pueda realizar sus actividades habituales por su  cuenta.  Descanse todo lo que pueda. Trate de descansar o tomar una siesta mientras el beb duerme. Hemorragia vaginal  Es normal tener un poco de hemorragia vaginal (loquios) despus del parto. Use un apsito sanitario para absorber el sangrado vaginal y la secrecin. ? Durante la primera semana despus del parto, la cantidad y el aspecto de los loquios a menudo es similar a las del perodo menstrual. ? Durante las siguientes semanas disminuir gradualmente hasta convertirse en una secrecin seca amarronada o [place]. ? En la [employer], los loquios se detienen [employer] 4 a 6semanas despus del [place]. Los sangrados vaginales pueden variar de mujer a Nurse, learning disability.  Cambie los apsitos sanitarios con frecuencia. Observe si hay cambios en el flujo, como: ? Aumento repentino en el volumen. ? Cambio en el color. ? Cogulos sanguneos grandes.  Si expulsa un cogulo de sangre, gurdelo y llame al mdico para informrselo. No deseche los cogulos de sangre por el inodoro antes de recibir indicaciones del mdico.  No use tampones ni se haga duchas vaginales hasta que el mdico la autorice.  Si no est amamantando, volver a tener su perodo entre 6 y 8 semanas despus del parto. Si est amamantando, puede volver a tener su perodo [place] 8 semanas despus del parto y el momento en que deje de Museum/gallery exhibitions officer. Cuidados perineales  Si su cesrea no fue planeada, y pas por el proceso de [place] de parto y puj antes del nacimiento, podra Surveyor, mining, hinchazn y Associate Professor del tejido que se encuentra entre la abertura de la vagina y el ano (perineo). Tambin pueden haberle  hecho una incisin en el tejido (episiotoma) o el tejido puede haberse desgarrado durante el parto. Siga las siguientes indicaciones como se lo haya indicado su mdico: ? Mantenga el perineo limpio y seco como se lo haya indicado el mdico. Utilice apsitos o aerosoles analgsicos y cremas, como se lo hayan  indicado. ? Si le realizaron una episiotoma o un desgarro vaginal, controle la zona todos los das para detectar signos de infeccin. Est atento a los siguientes signos:  Enrojecimiento, hinchazn o dolor.  Lquido o sangre.  Calor.  Pus o mal olor. ? Es posible que le den una botella rociadora para que use en lugar de limpiarse el rea con papel higinico despus de usar el bao. Cuando comience a sanar, podr usar la botella rociadora antes de secarse. Asegrese de secarse suavemente. ? Para aliviar el dolor causado por una episiotoma, un desgarro vaginal o hemorroides, trate de tomar un bao de asiento tibio 2 o 3 veces por da. Un bao de asiento es un bao de agua tibia que se toma mientras se est sentado. El agua solo debe llegar hasta las caderas y cubrir las nalgas.   Cuidado de las mamas  En los primeros das despus del parto, las mamas pueden sentirse pesadas, llenas e incmodas (congestin mamaria). Tambin puede tener leche que se escapa de sus senos. El mdico puede sugerirle mtodos para aliviar este malestar mamario. La congestin mamaria debera desaparecer al cabo de unos das.  Si est amamantando: ? Use un sostn que sujete y ajuste bien sus pechos. ? Mantenga los pezones secos y limpios. Aplquese cremas y ungentos como se lo haya indicado el mdico. ? Es posible que deba usar discos de algodn en el sostn para absorber la leche que se filtre. ? Puede tener contracciones uterinas cada vez que amamante durante varias semanas despus del parto. Las contracciones uterinas ayudan al tero a regresar a su tamao habitual. ? Si tiene algn problema con la lactancia materna, consulte con su mdico o con un asesor en lactancia.  Si no est amamantando: ? Evite tocarse las mamas, ya que esto puede hacer que produzcan ms leche. ? Use un sostn que le proporcione el ajuste correcto y compresas fras para reducir la hinchazn. ? No extraiga (saque) leche materna. Esto har  que produzca ms leche. Intimidad y sexualidad  Pregntele al mdico cundo puede retomar la actividad sexual. Esto puede depender de lo siguiente: ? Riesgo de sufrir una infeccin. ? Velocidad de cicatrizacin. ? Comodidad y deseo de retomar la actividad sexual.  Despus del parto, puede quedar embarazada incluso si no ha tenido todava su perodo. Si lo desea, hable con el mdico acerca de los mtodos de planificacin familiar o control de la natalidad (mtodos anticonceptivos). Estilo de vida  No consuma ningn producto que contenga nicotina o tabaco, como cigarrillos, cigarrillos electrnicos y tabaco de mascar. Si necesita ayuda para dejar de consumir, consulte al mdico.  No beba alcohol, especialmente si est amamantando. Comida y bebida  Beba suficiente lquido como para mantener la orina de color amarillo plido.  Coma alimentos ricos en fibras todos los das. Estos pueden ayudarla a prevenir o aliviar el estreimiento. Los alimentos ricos en fibra incluyen los siguientes: ? Panes y cereales integrales. ? Arroz integral. ? Frijoles. ? Frutas y verduras frescas.  Tome sus vitaminas prenatales hasta la visita de control de posparto o hasta que su mdico le indique que puede dejar de tomarlas.   Indicaciones generales  Concurra a todas las   visitas de control para usted y el beb como se lo haya indicado el mdico. La mayora de las mujeres visita al mdico para un control de posparto dentro de las primeras 3 a 6 semanas despus del [place]. Comunquese con un mdico si:  Se siente incapaz de controlar los cambios que implica tener un beb recin nacido y esos sentimientos no desaparecen.  Siente tristeza o preocupacin de forma inusual.  Siente dolor en las mamas, o estn duras o enrojecidas.  Tiene fiebre.  Tiene dificultad para retener la [place] o para impedir que la orina se escape.  Tiene poco inters o falta de inters en actividades que solan gustarle.  No ha  amamantado para nada y no ha tenido un perodo menstrual durante 12 semanas despus del [place].  Dej de amamantar al beb y no ha tenido su perodo menstrual durante 12 semanas despus de dejar de Museum/gallery exhibitions officer.  Tiene preguntas sobre su cuidado o el del beb.  Elimina un cogulo de sangre por la vagina. Solicite ayuda inmediatamente si:  Midwife.  Presenta dificultad para respirar.  Tiene un dolor repentino e intenso en la pierna.  Tiene dolor intenso o clicos en el abdomen.  Tiene un sangrado tan intenso en la vagina que empapa ms de un apsito sanitario en [employer]. El sangrado no debe ser ms abundante que el perodo ms intenso que haya tenido.  Presenta dolor de cabeza intenso.  Se desmaya.  Tiene visin borrosa o Nurse, adult.  Tiene secrecin vaginal con mal olor.  Tiene pensamientos de autolesionarse o de lesionar al beb. Si alguna vez siente que puede lastimarse o Physicist, medical a Economist, o tiene pensamientos de poner fin a su vida, busque ayuda de inmediato. Puede dirigirse al servicio de emergencias ms cercano o comunicarse con:  El servicio de emergencias de su localidad (911 en [place]  Una lnea de asistencia al suicida y Visual merchandiser en crisis, como National Suicide Prevention Lifeline (Lnea Nacional de Prevencin del Suicidio), al [phone]. Est disponible las 24 horas del da. Resumen  El perodo de tiempo desde el parto y Susan Freeman 6 a 12 semanas despus del parto se denomina perodo posparto.  Retome sus actividades normales de a poco segn lo indicado por el mdico.  Concurra a todas las visitas de control para usted y Research scientist (physical sciences) se lo haya indicado el mdico. Esta informacin no tiene Theme park manager el consejo del mdico. Asegrese de hacerle al mdico cualquier pregunta que tenga. Document Revised: 07/16/2018 Document Reviewed: 07/16/2018 Elsevier Patient Education  2021 [employer].

## 2021-03-17 NOTE — Lactation Note (Signed)
This note was copied from a baby's chart. Lactation Consultation Note  Patient Name: Susan Freeman [id number] Date: 03/17/2021 Reason for consult: Follow-up assessment;Term;Infant weight loss Age:29 hours  Visited with mom of 45 hours old FT female, she's a P3 and experienced BF. Mom and baby are going home today, baby is at 7% weight loss. Reviewed discharge education, feeding cues, cluster feeding, lactogenesis II and supply & demand.  When offered latch assistance mom voiced baby just fed, he's been cluster feeding last night. Per mom BF is going well and feedings at the breast are comfortable. When assisting with hand expression, noticed that breast were getting filled, firm and mom noted some discomfort when compressing. Advised to keep the milk flowing to prevent engorgement by putting baby to breast 8-12 times/24 hours and pump after feedings if baby has not emptied both breasts.  BF brochure (SP), BF resources (SP) and feeding diary (SP) were reviewed. FOB and GOB (paternal) were mom's support people. Family is Spanish speaking and this consult was done in patient's preferred language. Family reported all questions and concerns were answered, they're both aware of LC OP services and will call PRN.  Maternal Data Has patient been taught Hand Expression?: Yes  Feeding Mother's Current Feeding Choice: Breast Milk  Lactation Tools Discussed/Used Tools: Pump Breast pump type: Manual Pump Education: Setup, frequency, and cleaning;Milk Storage Reason for Pumping: mother's request Pumping frequency: as needed  Interventions Interventions: Breast feeding basics reviewed;Hand express;Breast massage;Education  Discharge Discharge Education: Engorgement and breast care;Warning signs for feeding baby Pump: Manual WIC Program: Yes  Consult Status Consult Status: Complete Date: 03/17/21 Follow-up type: Call as needed    [name] Venetia Constable 03/17/2021, 12:06 PM

## 2021-03-22 ENCOUNTER — Other Ambulatory Visit: Payer: Self-pay

## 2021-03-22 ENCOUNTER — Ambulatory Visit: Payer: Self-pay | Admitting: Family Medicine

## 2021-03-24 ENCOUNTER — Other Ambulatory Visit (HOSPITAL_COMMUNITY): Payer: Self-pay

## 2021-03-26 ENCOUNTER — Inpatient Hospital Stay (HOSPITAL_COMMUNITY): Admission: AD | Admit: 2021-03-26 | Payer: Self-pay | Source: Home / Self Care | Admitting: Obstetrics & Gynecology

## 2021-03-26 ENCOUNTER — Inpatient Hospital Stay (HOSPITAL_COMMUNITY): Payer: Self-pay

## 2021-04-26 ENCOUNTER — Ambulatory Visit (INDEPENDENT_AMBULATORY_CARE_PROVIDER_SITE_OTHER): Payer: Self-pay | Admitting: Family Medicine

## 2021-04-26 ENCOUNTER — Encounter: Payer: Self-pay | Admitting: Family Medicine

## 2021-04-26 ENCOUNTER — Other Ambulatory Visit: Payer: Self-pay

## 2021-04-26 NOTE — Progress Notes (Signed)
  [facility] Postpartum Visit   Susan Freeman is a 30 y.o. [id number] presenting for a postpartum visit.  She has the following concerns today: None She delivered via SVD at [redacted]w[redacted]d.  She reports her vaginal bleeding is now resolved about 15 days ago. She is breast feeding her infant. She feels she is bonding well. She is considering IUD for contraception, bnot placed in hospital.   Susan Freeman Postnatal Depression Scale: 0 (10 or higher is positive)  Reviewed pregnancy and delivery course: uncomplicated, no laceration. She is breastfeeding without pain or concerns.  Her son, Susan Freeman, is doing well.  She breastfed her other children as well.  -  Vitals:   04/26/21 0912  BP: 100/60  Pulse: 82  SpO2: 98%   Exam:  Physical Exam:  General: 30 y.o. female in NAD Lungs: Breathing comfortably on RA Abdomen: Soft, non-tender to palpation, non-distended, positive bowel sounds Skin: warm and dry Extremities: No edema  Pelvic exam: patient declined, no laceration and vaginal bleeding has stopped   A/P:  Postpartum visit: patient is 6 weeks postpartum following a SVD delivery at [redacted]w[redacted]d. -Discussed patients delivery and complications -Patient did not have a laceration, perineal healing reviewed. Patient expressed understanding -Patient has urinary incontinence? No -Patient is safe to resume physical and sexual activity -Patient does not want a pregnancy in the next year.  Desired family size is 4 children.  -Reviewed forms of contraception in tiered fashion. Patient desired IUD, [facility] to obtain.  Advised that she can become pregnant at this time and to call as soon as possible for IUD.   -Return to sexual activity and contraception discussed as above.  -Discussed birth spacing of 18 months -Breastfeeding: Yes, provided support of decision and resources as indicated  -Mood: doing well  -Discussed sleep and fatigue management and encouraged  family/community support.  -Postpartum vaccines: none, was not vaccinated for COVID, will wait for this -Need for postpartum diabetes screening:  none -Reviewed prior Pap and she is next due for Pap in 3 years.  -Return as needed.

## 2021-04-26 NOTE — Patient Instructions (Signed)
Plantar fasciitis   Parto vaginal, cuidados de puerperio Postpartum Care After Vaginal Delivery La siguiente informacin ofrece una gua sobre cmo cuidarse desde el momento en que nazca su beb y Susan Freeman 6 a 12 semanas despus del parto (perodo del posparto). Si tiene problemas o preguntas, pngase en contacto con su mdico paraobtener instrucciones ms especficas. Siga estas instrucciones en su casa: Hemorragia vaginal Es normal tener un poco de hemorragia vaginal (loquios) despus del parto. Use un apsito sanitario para el sangrado y secrecin. Durante la primera semana despus del parto, la cantidad y el aspecto de los loquios a menudo es similar a los del perodo menstrual. Durante las siguientes semanas disminuir gradualmente hasta convertirse en una secrecin seca amarronada o [place]. En la [employer], los loquios se detienen [employer] 4 a 6 semanas despus del parto, pero esto puede variar. Cambie los apsitos sanitarios con frecuencia. Observe si hay cambios en el flujo, como: Aumento repentino en el volumen. Cambio en el color. Cogulos de [employer]. Si expulsa un cogulo de sangre por la vagina, gurdelo y llame al mdico. No deseche los cogulos de sangre por el inodoro antes de hablar con su mdico. No use tampones ni se haga duchas vaginales hasta que el mdico la autorice. Si no est amamantando, volver a tener su perodo entre 6 y 8 semanas despus del parto. Si solamente alimenta al beb con [employer], podra no volver a tener su perodo hasta que deje de [place]. Cuidados perineales  Mantenga la zona entre la vagina y el ano (perineo) limpia y Magazine features editor. Utilice apsitos o [employer] y Control and instrumentation engineer, como se lo hayan indicado. Si le hicieron un corte quirrgico en el perineo (episiotoma) o tuvo un desgarro, controle la zona para detectar signos de infeccin hasta que sane. Est atenta a los siguientes signos: Aumento del  enrojecimiento, la hinchazn o Chief Technology Officer. Presenta lquido o sangre que supura del corte o Insurance account manager. [place]. Pus o mal olor. Es posible que le den una botella rociadora para que use en lugar de limpiarse el rea con papel higinico despus de usar el bao. Seque la zona dando golpecitos suaves. Para aliviar el dolor causado por una episiotoma, un desgarro o venas hinchadas en el ano (hemorroides), tome un bao de asiento tibio 2 o 3 veces por da. En un bao de asiento, el agua tibia solamente debe llegar [employer] caderas y [employer].  Cuidado de las [address] En los [address] despus del parto, las mamas pueden sentirse pesadas, llenas e incmodas (congestin [place]). Tambin puede escaparse leche de sus senos. Pdale al mdico que le sugiera formas de Acupuncturist. Si est amamantando: Use un sostn que sujete las mamas y [place]. Utilice protectores mamarios para Insurance account manager [employer] se filtre. Mantenga los pezones secos y limpios. Aplique cremas y [employer] se lo hayan indicado. Puede tener contracciones uterinas cada vez que amamante durante varias semanas despus del [place]. Esto ayuda a que el tero vuelva a su tamao normal. Si tiene algn problema con la lactancia materna, infrmelo al mdico o a un Holiday representative. Si no est amamantando: Evite tocarse las mamas. No extraiga (saque) [employer]. Al hacerlo, podran producir ms [employer]. Use un sostn que le proporcione el ajuste correcto y compresas fras para reducir la hinchazn. Intimidad y sexualidad Pregntele al mdico cundo puede retomar la actividad sexual. Esto puede depender de lo siguiente: Su riesgo de sufrir infecciones. La rapidez con la que  est sanando. Su comodidad y deseo de Designer, jewellery la actividad sexual. Despus del parto, puede quedar embarazada incluso si no ha tenido todava su perodo. Hable con el mdico acerca de los mtodos de control de la natalidad (mtodos anticonceptivos) o  planificacin familiar si desea Social research officer, government en el futuro. Medicamentos Use los medicamentos de venta libre y los recetados solamente como se lo haya indicado el mdico. Tome un laxante de venta libre para ayudar con las deposiciones como se lo haya indicado el mdico. Si le recetaron un antibitico, tmelo como se lo haya indicado el mdico. No deje de tomar el antibitico aunque comience a sentirse mejor. Revise todos los medicamentos con Engineer, civil (consulting) y actuales para comprobar la posible transferencia a la [place]. Actividad Retome sus actividades normales de a poco segn lo indicado por el mdico. Descanse todo lo que pueda. Tome siestas mientras el beb duerme. Comida y bebida  Beba suficiente lquido como para Pharmacologist la orina de color amarillo plido. Para ayudar a prevenir o Educational psychologist, coma alimentos ricos en [employer]. Elija una alimentacin saludable para ayudar a la lactancia o los objetivos de prdida de [employer]. Tome sus vitaminas prenatales hasta que su mdico le indique que deje de [place].  Recomendaciones/consejos generales No consuma ningn producto que contenga nicotina o tabaco. Estos productos incluyen cigarrillos, tabaco para Theatre manager y aparatos de vapeo, como los Administrator, Civil Service. Si necesita ayuda para dejar de fumar, consulte al mdico. No beba alcohol, especialmente si est amamantando. No tome medicamentos o frmacos que no se le receten, especialmente si est en perodo de lactancia. Visite al mdico para un control de posparto dentro de las primeras 3 a 6 semanas despus del [place]. Realice una visita posparto integral a ms tardar 12 semanas despus del parto. Asista a todas las visitas de seguimiento para usted y su beb. Comunquese con un mdico si: Siente tristeza o preocupacin de forma inusual. Las mamas se ponen rojas, le duelen o se endurecen. Tiene fiebre u otros signos de infeccin. Tiene sangrado que est  empapando una compresa por hora o tiene cogulos de [place]. Siente un dolor de cabeza intenso que no se [place] o tiene cambios en la visin. Tiene nuseas y vmitos y no puede comer o beber nada durante 24 horas. Solicite ayuda de inmediato si: Tiene dolor en el pecho o dificultad para respirar. Tiene un dolor repentino e intenso en la pierna. Tiene una convulsin o se desmaya. Tiene pensamientos acerca de lastimarse a usted misma o a su beb. Si alguna vez siente que puede lastimarse o Physicist, medical a Economist, o tiene pensamientos de poner fin a su vida, busque ayuda de inmediato. Dirjase al servicio de urgencias ms cercano o: Comunquese con el servicio de emergencias de su localidad (911 en los [place]). National Suicide Prevention Lifeline (Lnea Telefnica Nacional para la Prevencin del Suicidio) al [phone]. Esta lnea de asistencia al suicida est abierta las 24 horas del da. Enve un mensaje de texto a la lnea para casos de crisis al [phone] (en los [place] Resumen El perodo de tiempo despus el parto y Susan Freeman 6 a 12 semanas despus del parto se denomina perodo posparto. Asista a todas las visitas de seguimiento para usted y su beb. Revise todos los medicamentos con Engineer, civil (consulting) y actuales para comprobar la posible transferencia a la [place]. Pngase en contacto con un mdico si se siente inusualmente triste o preocupada durante el perodo posparto. Esta informacin no tiene  como fin reemplazar el consejo del mdico. Asegresede hacerle al mdico cualquier pregunta que tenga. Document Revised: 08/18/2020 Document Reviewed: 07/26/2020 Elsevier Patient Education  2022 [employer].
